# Patient Record
Sex: Female | Born: 1965 | Race: White | Hispanic: No | Marital: Married | Smoking: Never smoker
Health system: Southern US, Community
[De-identification: ages and names within clinical notes are randomized; demographics above are authoritative.]

## PROBLEM LIST (undated history)

## (undated) DIAGNOSIS — F419 Anxiety disorder, unspecified: Secondary | ICD-10-CM

## (undated) HISTORY — PX: NO PAST SURGERIES: SHX2092

## (undated) HISTORY — DX: Anxiety disorder, unspecified: F41.9

---

## 1998-05-02 ENCOUNTER — Inpatient Hospital Stay (HOSPITAL_COMMUNITY): Admission: AD | Admit: 1998-05-02 | Discharge: 1998-05-04 | Payer: Self-pay | Admitting: *Deleted

## 1999-06-10 ENCOUNTER — Encounter: Payer: Self-pay | Admitting: *Deleted

## 1999-06-10 ENCOUNTER — Encounter: Admission: RE | Admit: 1999-06-10 | Discharge: 1999-06-10 | Payer: Self-pay | Admitting: *Deleted

## 1999-06-19 ENCOUNTER — Other Ambulatory Visit: Admission: RE | Admit: 1999-06-19 | Discharge: 1999-06-19 | Payer: Self-pay | Admitting: *Deleted

## 2000-06-27 ENCOUNTER — Other Ambulatory Visit: Admission: RE | Admit: 2000-06-27 | Discharge: 2000-06-27 | Payer: Self-pay | Admitting: *Deleted

## 2001-07-19 ENCOUNTER — Other Ambulatory Visit: Admission: RE | Admit: 2001-07-19 | Discharge: 2001-07-19 | Payer: Self-pay | Admitting: *Deleted

## 2002-07-23 ENCOUNTER — Other Ambulatory Visit: Admission: RE | Admit: 2002-07-23 | Discharge: 2002-07-23 | Payer: Self-pay | Admitting: Obstetrics and Gynecology

## 2003-06-18 ENCOUNTER — Ambulatory Visit (HOSPITAL_COMMUNITY): Admission: RE | Admit: 2003-06-18 | Discharge: 2003-06-18 | Payer: Self-pay | Admitting: Family Medicine

## 2003-07-16 ENCOUNTER — Ambulatory Visit (HOSPITAL_COMMUNITY): Admission: RE | Admit: 2003-07-16 | Discharge: 2003-07-16 | Payer: Self-pay | Admitting: Family Medicine

## 2003-10-28 ENCOUNTER — Other Ambulatory Visit: Admission: RE | Admit: 2003-10-28 | Discharge: 2003-10-28 | Payer: Self-pay | Admitting: Obstetrics and Gynecology

## 2016-01-28 DIAGNOSIS — F419 Anxiety disorder, unspecified: Secondary | ICD-10-CM | POA: Diagnosis not present

## 2017-03-08 DIAGNOSIS — M1712 Unilateral primary osteoarthritis, left knee: Secondary | ICD-10-CM | POA: Diagnosis not present

## 2017-03-21 DIAGNOSIS — M1711 Unilateral primary osteoarthritis, right knee: Secondary | ICD-10-CM | POA: Diagnosis not present

## 2017-03-23 DIAGNOSIS — M1712 Unilateral primary osteoarthritis, left knee: Secondary | ICD-10-CM | POA: Diagnosis not present

## 2017-03-29 DIAGNOSIS — M1711 Unilateral primary osteoarthritis, right knee: Secondary | ICD-10-CM | POA: Diagnosis not present

## 2017-03-31 DIAGNOSIS — M222X2 Patellofemoral disorders, left knee: Secondary | ICD-10-CM | POA: Diagnosis not present

## 2017-04-05 DIAGNOSIS — M1711 Unilateral primary osteoarthritis, right knee: Secondary | ICD-10-CM | POA: Diagnosis not present

## 2017-04-06 DIAGNOSIS — Z1231 Encounter for screening mammogram for malignant neoplasm of breast: Secondary | ICD-10-CM | POA: Diagnosis not present

## 2017-04-07 DIAGNOSIS — M1711 Unilateral primary osteoarthritis, right knee: Secondary | ICD-10-CM | POA: Diagnosis not present

## 2017-04-12 DIAGNOSIS — M1712 Unilateral primary osteoarthritis, left knee: Secondary | ICD-10-CM | POA: Diagnosis not present

## 2017-04-14 DIAGNOSIS — M1712 Unilateral primary osteoarthritis, left knee: Secondary | ICD-10-CM | POA: Diagnosis not present

## 2017-04-18 ENCOUNTER — Encounter: Payer: Self-pay | Admitting: Obstetrics and Gynecology

## 2017-04-19 DIAGNOSIS — M1712 Unilateral primary osteoarthritis, left knee: Secondary | ICD-10-CM | POA: Diagnosis not present

## 2017-04-19 DIAGNOSIS — M222X2 Patellofemoral disorders, left knee: Secondary | ICD-10-CM | POA: Diagnosis not present

## 2017-05-19 ENCOUNTER — Ambulatory Visit: Payer: BLUE CROSS/BLUE SHIELD | Admitting: Obstetrics and Gynecology

## 2017-05-19 ENCOUNTER — Encounter: Payer: Self-pay | Admitting: Gastroenterology

## 2017-05-19 ENCOUNTER — Other Ambulatory Visit (HOSPITAL_COMMUNITY)
Admission: RE | Admit: 2017-05-19 | Discharge: 2017-05-19 | Disposition: A | Discharge status: Home / Self Care | Payer: BLUE CROSS/BLUE SHIELD | Source: Ambulatory Visit | Attending: Obstetrics and Gynecology | Admitting: Obstetrics and Gynecology

## 2017-05-19 ENCOUNTER — Encounter: Payer: Self-pay | Admitting: Obstetrics and Gynecology

## 2017-05-19 ENCOUNTER — Other Ambulatory Visit: Payer: Self-pay

## 2017-05-19 VITALS — BP 148/90 | HR 60 | Resp 14 | Ht 63.75 in | Wt 125.0 lb

## 2017-05-19 DIAGNOSIS — Z0000 Encounter for general adult medical examination without abnormal findings: Secondary | ICD-10-CM | POA: Insufficient documentation

## 2017-05-19 DIAGNOSIS — Z1211 Encounter for screening for malignant neoplasm of colon: Secondary | ICD-10-CM | POA: Diagnosis not present

## 2017-05-19 DIAGNOSIS — Z124 Encounter for screening for malignant neoplasm of cervix: Secondary | ICD-10-CM | POA: Insufficient documentation

## 2017-05-19 DIAGNOSIS — Z01419 Encounter for gynecological examination (general) (routine) without abnormal findings: Secondary | ICD-10-CM

## 2017-05-19 MED ORDER — ESCITALOPRAM OXALATE 10 MG PO TABS: 10.0000 mg | ORAL_TABLET | Freq: Every day | ORAL | 3 refills | Status: DC

## 2017-05-19 NOTE — Progress Notes (Signed)
51 y.o. [id number] Married CaucasianF here for annual exam.  No bleeding at all since January. She has occasional hot flashes, tolerable. No night sweats for many months. Not sexually active, marital issues in the past, now just best friends.     Patient's last menstrual period was 07/18/2016.          Sexually active: No.  The current method of family planning is none.    Exercising: Yes.    bike/ tennis Smoker:  no  Health Maintenance: Pap:  07-19-13 WNL per patient History of abnormal Pap:  Yes- years ago- repeat PAP normal MMG:  04-06-17 WNL Colonoscopy:  Never BMD:   Never TDaP:  12-27-16 Gardasil: N/A   reports that  has never smoked. she has never used smokeless tobacco. She reports that she does not drink alcohol or use drugs. Daughter is [id number], son is 619. Daughter is a 2nd Merchandiser, retailyear pharmacy student at [employer]. Son is a Printmakerreshman in a [employer] [place] (was bullied in high school, much happier in college). Homemaker.   No past medical history on file.  No past surgical history on file.  Current Outpatient Medications  Medication Sig Dispense Refill  . escitalopram (LEXAPRO) 10 MG tablet Take 10 mg daily by mouth.    . Ibuprofen-Famotidine (DUEXIS) 800-26.6 MG TABS Take daily by mouth.     No current facility-administered medications for this visit.     No family history on file.  Review of Systems  Constitutional: Negative.   HENT: Negative.   Eyes: Negative.   Respiratory: Negative.   Cardiovascular: Negative.   Gastrointestinal: Negative.   Endocrine: Negative.   Genitourinary: Negative.        No menstrual cycle since 07/2016 Hot flashes  Musculoskeletal: Negative.   Skin: Negative.   Allergic/Immunologic: Negative.   Neurological: Negative.   Psychiatric/Behavioral: Negative.     Exam:   BP (!) 160/90 (BP Location: Right Arm, Patient Position: Sitting, Cuff Size: Normal)   Pulse 60   Resp 14   Ht 5' 3.75" (1.619 m)   Wt 125 lb (56.7 kg)   LMP  07/18/2016   BMI 21.62 kg/m   Weight change: @WEIGHTCHANGE @ Height:   Height: 5' 3.75" (161.9 cm)  Ht Readings from Last 3 Encounters:  05/19/17 5' 3.75" (1.619 m)    General appearance: alert, cooperative and appears stated age Head: Normocephalic, without obvious abnormality, atraumatic Neck: no adenopathy, supple, symmetrical, trachea midline and thyroid normal to inspection and palpation Lungs: clear to auscultation bilaterally Cardiovascular: regular rate and rhythm Breasts: normal appearance, no masses or tenderness Abdomen: soft, non-tender; non distended,  no masses,  no organomegaly Extremities: extremities normal, atraumatic, no cyanosis or edema Skin: Skin color, texture, turgor normal. No rashes or lesions Lymph nodes: Cervical, supraclavicular, and axillary nodes normal. No abnormal inguinal nodes palpated Neurologic: Grossly normal   Pelvic: External genitalia:  no lesions              Urethra:  normal appearing urethra with no masses, tenderness or lesions              Bartholins and Skenes: normal                 Vagina: normal appearing vagina with normal color and discharge, no lesions              Cervix: no lesions               Bimanual Exam:  Uterus:  normal size, contour, position, consistency, mobility, non-tender              Adnexa: no mass, fullness, tenderness               Rectovaginal: Confirms               Anus:  normal sphincter tone, no lesions  Chaperone was present for exam.  A:  Well Woman with normal exam  Elevated BP,   Depression, well controlled with Lexapro  P:   At home her BP is completely normal, stressed being here, she will f/u with her primary  Screening labs  Continue Lexapro  Needs colonoscopy  Mammogram UTD  Discussed breast self exam  Discussed calcium and vit D intake

## 2017-05-19 NOTE — Addendum Note (Signed)
Addended by: Tobi BastosJERTSON, Jakeel Starliper E on: 05/19/2017 07:22 PM   Modules accepted: Orders

## 2017-05-20 LAB — LIPID PANEL
Chol/HDL Ratio: 3.1 ratio (ref 0.0–4.4)
Cholesterol, Total: 194 mg/dL (ref 100–199)
HDL: 62 mg/dL (ref >39)
LDL Calculated: 121 mg/dL — ABNORMAL HIGH (ref 0–99)
Triglycerides: 53 mg/dL (ref 0–149)
VLDL Cholesterol Cal: 11 mg/dL (ref 5–40)

## 2017-05-20 LAB — CBC
Hematocrit: 40.6 % (ref 34.0–46.6)
Hemoglobin: 14 g/dL (ref 11.1–15.9)
MCH: 31 pg (ref 26.6–33.0)
MCHC: 34.5 g/dL (ref 31.5–35.7)
MCV: 90 fL (ref 79–97)
Platelets: 256 10*3/uL (ref 150–379)
RBC: 4.51 x10E6/uL (ref 3.77–5.28)
RDW: 13.2 % (ref 12.3–15.4)
WBC: 5.2 10*3/uL (ref 3.4–10.8)

## 2017-05-20 LAB — COMPREHENSIVE METABOLIC PANEL
ALT: 13 IU/L (ref 0–32)
AST: 19 IU/L (ref 0–40)
Albumin/Globulin Ratio: 1.8 (ref 1.2–2.2)
Albumin: 4.4 g/dL (ref 3.5–5.5)
Alkaline Phosphatase: 111 IU/L (ref 39–117)
BUN/Creatinine Ratio: 11 (ref 9–23)
BUN: 10 mg/dL (ref 6–24)
Bilirubin Total: 0.4 mg/dL (ref 0.0–1.2)
CO2: 24 mmol/L (ref 20–29)
Calcium: 9.4 mg/dL (ref 8.7–10.2)
Chloride: 102 mmol/L (ref 96–106)
Creatinine, Ser: 0.95 mg/dL (ref 0.57–1.00)
GFR calc Af Amer: 79 mL/min/{1.73_m2} (ref >59)
GFR calc non Af Amer: 69 mL/min/{1.73_m2} (ref >59)
Globulin, Total: 2.5 g/dL (ref 1.5–4.5)
Glucose: 88 mg/dL (ref 65–99)
Potassium: 4.6 mmol/L (ref 3.5–5.2)
Sodium: 141 mmol/L (ref 134–144)
Total Protein: 6.9 g/dL (ref 6.0–8.5)

## 2017-05-24 LAB — CYTOLOGY - PAP
DIAGNOSIS: NEGATIVE
HPV (WINDOPATH): NOT DETECTED

## 2017-06-06 ENCOUNTER — Telehealth: Payer: Self-pay | Admitting: Obstetrics and Gynecology

## 2017-06-06 NOTE — Telephone Encounter (Signed)
Patient is asking for her recent pap result.

## 2017-06-06 NOTE — Telephone Encounter (Signed)
Spoke with patient. Patient states she had not received pap results via mail, f/u with results. Advised patient pap dated 05/19/17 normal. Next AEX previously scheduled. Patient verbalizes understanding and thankful for return call.    Notes recorded by Lorri FrederickHanner, Martha E, LPN on 40/98/119111/13/2018 at 1:03 PM EST AEX 05-25-18 -eh ------  Notes recorded by Romualdo BolkJertson, Peregrine Nolt Evelyn, MD on 05/24/2017 at 12:50 PM EST 02 recall  Routing to provider for final review. Patient is agreeable to disposition. Will close encounter.

## 2017-07-20 ENCOUNTER — Ambulatory Visit (AMBULATORY_SURGERY_CENTER): Payer: Self-pay | Admitting: *Deleted

## 2017-07-20 ENCOUNTER — Other Ambulatory Visit: Payer: Self-pay

## 2017-07-20 VITALS — Ht 64.0 in | Wt 130.0 lb

## 2017-07-20 DIAGNOSIS — Z1211 Encounter for screening for malignant neoplasm of colon: Secondary | ICD-10-CM

## 2017-07-20 MED ORDER — PEG-KCL-NACL-NASULF-NA ASC-C 140 G PO SOLR: 1.0000 | Freq: Once | ORAL | 0 refills | Status: AC

## 2017-07-20 NOTE — Progress Notes (Signed)
Patient denies any allergies to eggs or soy. Patient denies any past surgeries, and no neck problems. Patient denies any oxygen use at home. Patient denies taking any diet/weight loss medications or blood thinners. EMMI education assisgned to patient on colonoscopy, this was explained and instructions given to patient.

## 2017-07-28 ENCOUNTER — Encounter: Payer: Self-pay | Admitting: Gastroenterology

## 2017-08-03 ENCOUNTER — Ambulatory Visit (AMBULATORY_SURGERY_CENTER): Payer: BLUE CROSS/BLUE SHIELD | Admitting: Gastroenterology

## 2017-08-03 ENCOUNTER — Other Ambulatory Visit: Payer: Self-pay

## 2017-08-03 ENCOUNTER — Encounter: Payer: Self-pay | Admitting: Gastroenterology

## 2017-08-03 VITALS — BP 108/59 | HR 67 | Temp 98.4°F | Resp 9 | Ht 64.0 in | Wt 130.0 lb

## 2017-08-03 DIAGNOSIS — Z1211 Encounter for screening for malignant neoplasm of colon: Secondary | ICD-10-CM | POA: Diagnosis not present

## 2017-08-03 DIAGNOSIS — Z1212 Encounter for screening for malignant neoplasm of rectum: Secondary | ICD-10-CM | POA: Diagnosis not present

## 2017-08-03 MED ORDER — SODIUM CHLORIDE 0.9 % IV SOLN: 500.0000 mL | INTRAVENOUS | Status: AC

## 2017-08-03 NOTE — Progress Notes (Signed)
Report to PACU, RN, vss, BBS= Clear.  

## 2017-08-03 NOTE — Patient Instructions (Signed)
YOU HAD AN ENDOSCOPIC PROCEDURE TODAY AT THE [facility]:   Refer to the procedure report that was given to you for any specific questions about what was found during the examination.  If the procedure report does not answer your questions, please call your gastroenterologist to clarify.  If you requested that your care partner not be given the details of your procedure findings, then the procedure report has been included in a sealed envelope for you to review at your convenience later.  YOU SHOULD EXPECT: Some feelings of bloating in the abdomen. Passage of more gas than usual.  Walking can help get rid of the air that was put into your GI tract during the procedure and reduce the bloating. If you had a lower endoscopy (such as a colonoscopy or flexible sigmoidoscopy) you may notice spotting of blood in your stool or on the toilet paper. If you underwent a bowel prep for your procedure, you may not have a normal bowel movement for a few days.  Please Note:  You might notice some irritation and congestion in your nose or some drainage.  This is from the oxygen used during your procedure.  There is no need for concern and it should clear up in a day or so.  SYMPTOMS TO REPORT IMMEDIATELY:   Following lower endoscopy (colonoscopy or flexible sigmoidoscopy):  Excessive amounts of blood in the stool  Significant tenderness or worsening of abdominal pains  Swelling of the abdomen that is new, acute  Fever of 100F or higher  Please see handouts given to you on hemorrhoids.  For urgent or emergent issues, a gastroenterologist can be reached at any hour by calling [phone].   DIET:  We do recommend a small meal at first, but then you may proceed to your regular diet.  Drink plenty of fluids but you should avoid alcoholic beverages for 24 hours.  ACTIVITY:  You should plan to take it easy for the rest of today and you should NOT DRIVE or use heavy machinery until tomorrow (because  of the sedation medicines used during the test).    FOLLOW UP: Our staff will call the number listed on your records the next business day following your procedure to check on you and address any questions or concerns that you may have regarding the information given to you following your procedure. If we do not reach you, we will leave a message.  However, if you are feeling well and you are not experiencing any problems, there is no need to return our call.  We will assume that you have returned to your regular daily activities without incident.  If any biopsies were taken you will be contacted by phone or by letter within the next 1-3 weeks.  Please call us at [phone] if you have not heard about the biopsies in 3 weeks.    SIGNATURES/CONFIDENTIALITY: You and/or your care partner have signed paperwork which will be entered into your electronic medical record.  These signatures attest to the fact that that the information above on your After Visit Summary has been reviewed and is understood.  Full responsibility of the confidentiality of this discharge information lies with you and/or your care-partner.  Thank you for letting us take care of your healthcare needs today.

## 2017-08-03 NOTE — Op Note (Signed)
[facility] Patient Name: Tonea Leiphart Procedure Date: 08/03/2017 11:03 AM MRN: [id number] Endoscopist: Napoleon Form , MD Age: 52 Referring MD:  Date of Birth: Jul 12, 1966 Gender: Female Account #: [id number] Procedure:                Colonoscopy Indications:              Screening for colorectal malignant neoplasm Medicines:                Monitored Anesthesia Care Procedure:                Pre-Anesthesia Assessment:                           - Prior to the procedure, a History and Physical                            was performed, and patient medications and                            allergies were reviewed. The patient's tolerance of                            previous anesthesia was also reviewed. The risks                            and benefits of the procedure and the sedation                            options and risks were discussed with the patient.                            All questions were answered, and informed consent                            was obtained. Prior Anticoagulants: The patient has                            taken no previous anticoagulant or antiplatelet                            agents. ASA Grade Assessment: II - A patient with                            mild systemic disease. After reviewing the risks                            and benefits, the patient was deemed in                            satisfactory condition to undergo the procedure.                           After obtaining informed consent, the colonoscope  was passed under direct vision. Throughout the                            procedure, the patient's blood pressure, pulse, and                            oxygen saturations were monitored continuously. The                            Colonoscope was introduced through the anus and                            advanced to the the terminal ileum, with                            identification of  the appendiceal orifice and IC                            valve. The colonoscopy was performed without                            difficulty. The patient tolerated the procedure                            well. The quality of the bowel preparation was                            good. The terminal ileum, ileocecal valve,                            appendiceal orifice, and rectum were photographed. Scope In: 11:11:18 AM Scope Out: 11:22:57 AM Scope Withdrawal Time: 0 hours 8 minutes 8 seconds  Total Procedure Duration: 0 hours 11 minutes 39 seconds  Findings:                 The perianal exam findings include internal                            hemorrhoids that prolapse with straining, but                            require manual replacement into the anal canal                            (Grade III).                           Non-bleeding external and internal hemorrhoids were                            found during retroflexion and during perianal exam.                            The hemorrhoids were large.  The exam was otherwise without abnormality. Complications:            No immediate complications. Estimated Blood Loss:     Estimated blood loss: none. Impression:               - Internal hemorrhoids that prolapse with                            straining, but require manual replacement into the                            anal canal (Grade III) found on perianal exam.                           - Non-bleeding external and internal hemorrhoids.                           - The examination was otherwise normal.                           - No specimens collected. Recommendation:           - Patient has a contact number available for                            emergencies. The signs and symptoms of potential                            delayed complications were discussed with the                            patient. Return to normal activities tomorrow.                             Written discharge instructions were provided to the                            patient.                           - Resume previous diet.                           - Continue present medications.                           - Repeat colonoscopy in 10 years for screening                            purposes.                           - Refer to a colo-rectal surgeon Dr Maisie Fus at                            appointment to be scheduled for management of large  prolapsed hemorrhoids Napoleon Form, MD 08/03/2017 11:28:11 AM This report has been signed electronically.

## 2017-08-04 ENCOUNTER — Telehealth: Payer: Self-pay | Admitting: *Deleted

## 2017-08-04 NOTE — Telephone Encounter (Signed)
  Follow up Call-  Call back number 08/03/2017  Post procedure Call Back phone  # [phone]  Permission to leave phone message Yes  Some recent data might be hidden     Patient questions:  Do you have a fever, pain , or abdominal swelling? No. Pain Score  0 *  Have you tolerated food without any problems? Yes.    Have you been able to return to your normal activities? Yes.    Do you have any questions about your discharge instructions: Diet   No. Medications  No. Follow up visit  No.  Do you have questions or concerns about your Care? No.  Actions: * If pain score is 4 or above: No action needed, pain <4.

## 2018-04-10 DIAGNOSIS — Z1231 Encounter for screening mammogram for malignant neoplasm of breast: Secondary | ICD-10-CM | POA: Diagnosis not present

## 2018-04-25 ENCOUNTER — Encounter: Payer: Self-pay | Admitting: Obstetrics and Gynecology

## 2018-05-23 NOTE — Progress Notes (Signed)
52 y.o. [id number] Married White or Caucasian Not Hispanic or Latino female here for annual exam.  She is on Lexapro, helps her vasomotor symptoms and her mood. Not sexually active, good friends.     Patient's last menstrual period was 07/18/2016.          Sexually active: No.  The current method of family planning is post menopausal status.    Exercising: Yes.    peloton cycle, isometric and weights  Smoker:  no  Health Maintenance: Pap:  05/19/2017 WNL NEG HR HPV History of abnormal Pap:  Yes- years ago- repeat PAP normal MMG:  04/10/2018 Birads 1 negative Colonoscopy:  07/2017 WNL BMD:   Never TDaP:  12-27-16 Gardasil: N/A   reports that she has never smoked. She has never used smokeless tobacco. She reports that she drinks about 1.0 - 2.0 standard drinks of alcohol per week. She reports that she does not use drugs. Daughter has one more year of pharmacy school [facility] [employer], will likely do a a residency and MBA. Son is sophomore, Manufacturing engineerconstruction management. Will graduate in May.   Past Medical History:  Diagnosis Date  . Anxiety     Past Surgical History:  Procedure Laterality Date  . NO PAST SURGERIES      Current Outpatient Medications  Medication Sig Dispense Refill  . escitalopram (LEXAPRO) 10 MG tablet Take 1 tablet (10 mg total) daily by mouth. 90 tablet 3  . Ibuprofen-Famotidine (DUEXIS) 800-26.6 MG TABS Take daily by mouth.     Current Facility-Administered Medications  Medication Dose Route Frequency Provider Last Rate Last Dose  . 0.9 %  sodium chloride infusion  500 mL Intravenous Continuous Nandigam, Eleonore ChiquitoKavitha V, MD        Family History  Problem Relation Age of Onset  . Osteoarthritis Mother   . Osteoporosis Mother   . Thyroid disease Mother   . Pancreatic cancer Father   . Heart disease Brother   . Psoriasis Brother   . Diabetes Brother   . Rheum arthritis Maternal Uncle   . Breast cancer Maternal Uncle   . Heart attack Maternal Grandmother 89  . Aneurysm  Paternal Grandmother   . Heart disease Paternal Grandfather   . Colon cancer Neg Hx     Review of Systems  Constitutional: Negative.        Weight gain  HENT: Negative.   Eyes: Negative.   Respiratory: Negative.   Cardiovascular: Negative.   Gastrointestinal: Negative.   Endocrine: Negative.   Genitourinary: Positive for frequency and urgency.  Musculoskeletal:       Joint/muscle pain  Skin: Negative.   Allergic/Immunologic: Negative.   Neurological: Negative.   Hematological: Negative.   Psychiatric/Behavioral: Negative.     Exam:   BP 138/90 (BP Location: Right Arm, Patient Position: Sitting, Cuff Size: Normal)   Pulse 60   Ht 5\' 4"  (1.626 m)   Wt 130 lb 9.6 oz (59.2 kg)   LMP 07/18/2016   BMI 22.42 kg/m   Weight change: @WEIGHTCHANGE @ Height:   Height: 5\' 4"  (162.6 cm)  Ht Readings from Last 3 Encounters:  05/25/18 5\' 4"  (1.626 m)  08/03/17 5\' 4"  (1.626 m)  07/20/17 5\' 4"  (1.626 m)    General appearance: alert, cooperative and appears stated age Head: Normocephalic, without obvious abnormality, atraumatic Neck: no adenopathy, supple, symmetrical, trachea midline and thyroid normal to inspection and palpation Lungs: clear to auscultation bilaterally Cardiovascular: regular rate and rhythm Breasts: normal appearance, no masses or tenderness Abdomen:  soft, non-tender; non distended,  no masses,  no organomegaly Extremities: extremities normal, atraumatic, no cyanosis or edema Skin: Skin color, texture, turgor normal. No rashes or lesions Lymph nodes: Cervical, supraclavicular, and axillary nodes normal. No abnormal inguinal nodes palpated Neurologic: Grossly normal   Pelvic: External genitalia:  no lesions              Urethra:  normal appearing urethra with no masses, tenderness or lesions              Bartholins and Skenes: normal                 Vagina: atrophic appearing vagina with normal color and discharge, no lesions              Cervix: no lesions                Bimanual Exam:  Uterus:  normal size, contour, position, consistency, mobility, non-tender              Adnexa: no mass, fullness, tenderness               Rectovaginal: Confirms               Anus:  normal sphincter tone, no lesions  Chaperone was present for exam.  A:  Well Woman with normal exam  Anxiety, controlled with lexapro  Vasomotor symptoms, controlled on lexapro  P:   No pap this year  Discussed breast self exam  Discussed calcium and vit D intake  Mammogram and colonoscopy UTD  Continue lexapro  Screening labs

## 2018-05-25 ENCOUNTER — Ambulatory Visit: Payer: BLUE CROSS/BLUE SHIELD | Admitting: Obstetrics and Gynecology

## 2018-05-25 ENCOUNTER — Other Ambulatory Visit: Payer: Self-pay

## 2018-05-25 ENCOUNTER — Encounter: Payer: Self-pay | Admitting: Obstetrics and Gynecology

## 2018-05-25 VITALS — BP 138/90 | HR 60 | Ht 64.0 in | Wt 130.6 lb

## 2018-05-25 DIAGNOSIS — Z0000 Encounter for general adult medical examination without abnormal findings: Secondary | ICD-10-CM

## 2018-05-25 DIAGNOSIS — Z01419 Encounter for gynecological examination (general) (routine) without abnormal findings: Secondary | ICD-10-CM | POA: Diagnosis not present

## 2018-05-25 MED ORDER — ESCITALOPRAM OXALATE 10 MG PO TABS: 10.0000 mg | ORAL_TABLET | Freq: Every day | ORAL | 3 refills | Status: DC

## 2018-05-25 NOTE — Patient Instructions (Signed)
EXERCISE AND DIET:  We recommended that you start or continue a regular exercise program for good health. Regular exercise means any activity that makes your heart beat faster and makes you sweat.  We recommend exercising at least 30 minutes per day at least 3 days a week, preferably 4 or 5.  We also recommend a diet low in fat and sugar.  Inactivity, poor dietary choices and obesity can cause diabetes, heart attack, stroke, and kidney damage, among others.    ALCOHOL AND SMOKING:  Women should limit their alcohol intake to no more than 7 drinks/beers/glasses of wine (combined, not each!) per week. Moderation of alcohol intake to this level decreases your risk of breast cancer and liver damage. And of course, no recreational drugs are part of a healthy lifestyle.  And absolutely no smoking or even second hand smoke. Most people know smoking can cause heart and lung diseases, but did you know it also contributes to weakening of your bones? Aging of your skin?  Yellowing of your teeth and nails?  CALCIUM AND VITAMIN D:  Adequate intake of calcium and Vitamin D are recommended.  The recommendations for exact amounts of these supplements seem to change often, but generally speaking 600 mg of calcium (either carbonate or citrate) and 800 units of Vitamin D per day seems prudent. Certain women may benefit from higher intake of Vitamin D.  If you are among these women, your doctor will have told you during your visit.    PAP SMEARS:  Pap smears, to check for cervical cancer or precancers,  have traditionally been done yearly, although recent scientific advances have shown that most women can have pap smears less often.  However, every woman still should have a physical exam from her gynecologist every year. It will include a breast check, inspection of the vulva and vagina to check for abnormal growths or skin changes, a visual exam of the cervix, and then an exam to evaluate the size and shape of the uterus and  ovaries.  And after 54 years of age, a rectal exam is indicated to check for rectal cancers. We will also provide age appropriate advice regarding health maintenance, like when you should have certain vaccines, screening for sexually transmitted diseases, bone density testing, colonoscopy, mammograms, etc.   MAMMOGRAMS:  All women over 40 years old should have a yearly mammogram. Many facilities now offer a "3D" mammogram, which may cost around $50 extra out of pocket. If possible,  we recommend you accept the option to have the 3D mammogram performed.  It both reduces the number of women who will be called back for extra views which then turn out to be normal, and it is better than the routine mammogram at detecting truly abnormal areas.    COLONOSCOPY:  Colonoscopy to screen for colon cancer is recommended for all women at age 50.  We know, you hate the idea of the prep.  We agree, BUT, having colon cancer and not knowing it is worse!!  Colon cancer so often starts as a polyp that can be seen and removed at colonscopy, which can quite literally save your life!  And if your first colonoscopy is normal and you have no family history of colon cancer, most women don't have to have it again for 10 years.  Once every ten years, you can do something that may end up saving your life, right?  We will be happy to help you get it scheduled when you are ready.    Be sure to check your insurance coverage so you understand how much it will cost.  It may be covered as a preventative service at no cost, but you should check your particular policy.      Breast Self-Awareness Breast self-awareness means being familiar with how your breasts look and feel. It involves checking your breasts regularly and reporting any changes to your health care provider. Practicing breast self-awareness is important. A change in your breasts can be a sign of a serious medical problem. Being familiar with how your breasts look and feel allows  you to find any problems early, when treatment is more likely to be successful. All women should practice breast self-awareness, including women who have had breast implants. How to do a breast self-exam One way to learn what is normal for your breasts and whether your breasts are changing is to do a breast self-exam. To do a breast self-exam: Look for Changes  1. Remove all the clothing above your waist. 2. Stand in front of a mirror in a room with good lighting. 3. Put your hands on your hips. 4. Push your hands firmly downward. 5. Compare your breasts in the mirror. Look for differences between them (asymmetry), such as: ? Differences in shape. ? Differences in size. ? Puckers, dips, and bumps in one breast and not the other. 6. Look at each breast for changes in your skin, such as: ? Redness. ? Scaly areas. 7. Look for changes in your nipples, such as: ? Discharge. ? Bleeding. ? Dimpling. ? Redness. ? A change in position. Feel for Changes  Carefully feel your breasts for lumps and changes. It is best to do this while lying on your back on the floor and again while sitting or standing in the shower or tub with soapy water on your skin. Feel each breast in the following way:  Place the arm on the side of the breast you are examining above your head.  Feel your breast with the other hand.  Start in the nipple area and make  inch (2 cm) overlapping circles to feel your breast. Use the pads of your three middle fingers to do this. Apply light pressure, then medium pressure, then firm pressure. The light pressure will allow you to feel the tissue closest to the skin. The medium pressure will allow you to feel the tissue that is a little deeper. The firm pressure will allow you to feel the tissue close to the ribs.  Continue the overlapping circles, moving downward over the breast until you feel your ribs below your breast.  Move one finger-width toward the center of the body.  Continue to use the  inch (2 cm) overlapping circles to feel your breast as you move slowly up toward your collarbone.  Continue the up and down exam using all three pressures until you reach your armpit.  Write Down What You Find  Write down what is normal for each breast and any changes that you find. Keep a written record with breast changes or normal findings for each breast. By writing this information down, you do not need to depend only on memory for size, tenderness, or location. Write down where you are in your menstrual cycle, if you are still menstruating. If you are having trouble noticing differences in your breasts, do not get discouraged. With time you will become more familiar with the variations in your breasts and more comfortable with the exam. How often should I examine my breasts? Examine   your breasts every month. If you are breastfeeding, the best time to examine your breasts is after a feeding or after using a breast pump. If you menstruate, the best time to examine your breasts is 5-7 days after your period is over. During your period, your breasts are lumpier, and it may be more difficult to notice changes. When should I see my health care provider? See your health care provider if you notice:  A change in shape or size of your breasts or nipples.  A change in the skin of your breast or nipples, such as a reddened or scaly area.  Unusual discharge from your nipples.  A lump or thick area that was not there before.  Pain in your breasts.  Anything that concerns you.  This information is not intended to replace advice given to you by your health care provider. Make sure you discuss any questions you have with your health care provider. Document Released: 06/28/2005 Document Revised: 12/04/2015 Document Reviewed: 05/18/2015 Elsevier Interactive Patient Education  2018 Elsevier Inc[employer]  

## 2018-05-26 LAB — COMPREHENSIVE METABOLIC PANEL
A/G RATIO: 1.6 (ref 1.2–2.2)
ALBUMIN: 4.6 g/dL (ref 3.5–5.5)
ALK PHOS: 125 IU/L — AB (ref 39–117)
ALT: 12 IU/L (ref 0–32)
AST: 18 IU/L (ref 0–40)
BUN / CREAT RATIO: 17 (ref 9–23)
BUN: 16 mg/dL (ref 6–24)
Bilirubin Total: 0.4 mg/dL (ref 0.0–1.2)
CHLORIDE: 101 mmol/L (ref 96–106)
CO2: 23 mmol/L (ref 20–29)
Calcium: 9.7 mg/dL (ref 8.7–10.2)
Creatinine, Ser: 0.96 mg/dL (ref 0.57–1.00)
GFR calc Af Amer: 78 mL/min/{1.73_m2} (ref >59)
GFR calc non Af Amer: 67 mL/min/{1.73_m2} (ref >59)
GLUCOSE: 81 mg/dL (ref 65–99)
Globulin, Total: 2.8 g/dL (ref 1.5–4.5)
Potassium: 4.2 mmol/L (ref 3.5–5.2)
Sodium: 141 mmol/L (ref 134–144)
Total Protein: 7.4 g/dL (ref 6.0–8.5)

## 2018-05-26 LAB — LIPID PANEL
CHOLESTEROL TOTAL: 219 mg/dL — AB (ref 100–199)
Chol/HDL Ratio: 2.9 ratio (ref 0.0–4.4)
HDL: 76 mg/dL (ref >39)
LDL Calculated: 130 mg/dL — ABNORMAL HIGH (ref 0–99)
Triglycerides: 63 mg/dL (ref 0–149)
VLDL CHOLESTEROL CAL: 13 mg/dL (ref 5–40)

## 2018-05-26 LAB — CBC
Hematocrit: 41.4 % (ref 34.0–46.6)
Hemoglobin: 14.2 g/dL (ref 11.1–15.9)
MCH: 31.5 pg (ref 26.6–33.0)
MCHC: 34.3 g/dL (ref 31.5–35.7)
MCV: 92 fL (ref 79–97)
PLATELETS: 278 10*3/uL (ref 150–450)
RBC: 4.51 x10E6/uL (ref 3.77–5.28)
RDW: 12.3 % (ref 12.3–15.4)
WBC: 6.6 10*3/uL (ref 3.4–10.8)

## 2018-05-29 ENCOUNTER — Other Ambulatory Visit: Payer: Self-pay

## 2018-05-29 DIAGNOSIS — R748 Abnormal levels of other serum enzymes: Secondary | ICD-10-CM

## 2018-05-30 ENCOUNTER — Other Ambulatory Visit (INDEPENDENT_AMBULATORY_CARE_PROVIDER_SITE_OTHER): Payer: BLUE CROSS/BLUE SHIELD

## 2018-05-30 DIAGNOSIS — R748 Abnormal levels of other serum enzymes: Secondary | ICD-10-CM | POA: Diagnosis not present

## 2018-05-31 LAB — ALKALINE PHOSPHATASE: Alkaline Phosphatase: 116 IU/L (ref 39–117)

## 2019-04-16 DIAGNOSIS — Z20828 Contact with and (suspected) exposure to other viral communicable diseases: Secondary | ICD-10-CM | POA: Diagnosis not present

## 2019-04-24 DIAGNOSIS — Z20828 Contact with and (suspected) exposure to other viral communicable diseases: Secondary | ICD-10-CM | POA: Diagnosis not present

## 2019-04-27 ENCOUNTER — Encounter: Payer: Self-pay | Admitting: Obstetrics and Gynecology

## 2019-04-27 DIAGNOSIS — Z1231 Encounter for screening mammogram for malignant neoplasm of breast: Secondary | ICD-10-CM | POA: Diagnosis not present

## 2019-05-28 ENCOUNTER — Other Ambulatory Visit: Payer: Self-pay

## 2019-05-30 ENCOUNTER — Other Ambulatory Visit: Payer: Self-pay

## 2019-05-30 ENCOUNTER — Ambulatory Visit (INDEPENDENT_AMBULATORY_CARE_PROVIDER_SITE_OTHER): Payer: BC Managed Care – PPO | Admitting: Obstetrics and Gynecology

## 2019-05-30 ENCOUNTER — Encounter: Payer: Self-pay | Admitting: Obstetrics and Gynecology

## 2019-05-30 VITALS — BP 158/96 | HR 64 | Temp 97.4°F | Ht 64.0 in | Wt 134.8 lb

## 2019-05-30 DIAGNOSIS — Z01419 Encounter for gynecological examination (general) (routine) without abnormal findings: Secondary | ICD-10-CM

## 2019-05-30 DIAGNOSIS — R030 Elevated blood-pressure reading, without diagnosis of hypertension: Secondary | ICD-10-CM

## 2019-05-30 DIAGNOSIS — Z0000 Encounter for general adult medical examination without abnormal findings: Secondary | ICD-10-CM

## 2019-05-30 DIAGNOSIS — E789 Disorder of lipoprotein metabolism, unspecified: Secondary | ICD-10-CM | POA: Diagnosis not present

## 2019-05-30 MED ORDER — ESCITALOPRAM OXALATE 10 MG PO TABS: 10.0000 mg | ORAL_TABLET | Freq: Every day | ORAL | 3 refills | Status: DC

## 2019-05-30 NOTE — Progress Notes (Signed)
53 y.o. [id number] Married White or Caucasian Not Hispanic or Latino female here for annual exam. On lexapro for vasomotor symptoms and her mood.   Still having hot flashes or sweats, mainly at night. Overall tolerable with the lexapro. Mood is good. No vaginal bleeding.     Patient's last menstrual period was 07/18/2016.          Sexually active: No.  The current method of family planning is post menopausal status.    Exercising: Yes.    peloton bike, spin bike, 6-7 times a week Smoker:  no  Health Maintenance: Pap:05/19/2017 WNL NEG HR HPV History of abnormal Pap:Yes- years ago- repeat PAP normal MMG:04/27/2019 Birads 1 negative Colonoscopy:07/2017 WNL JOI:NOMVE TDaP:12-27-16 Gardasil:N/A   reports that she has never smoked. She has never used smokeless tobacco. She reports current alcohol use of about 1.0 - 2.0 standard drinks of alcohol per week. She reports that she does not use drugs. Daughter is Chiropodist school this year. She will do a fellowship and get her MBA. Son graduated from [employer], he is working in [employer], Research officer, political party.    Past Medical History:  Diagnosis Date  . Anxiety     Past Surgical History:  Procedure Laterality Date  . NO PAST SURGERIES      Current Outpatient Medications  Medication Sig Dispense Refill  . escitalopram (LEXAPRO) 10 MG tablet Take 1 tablet (10 mg total) by mouth daily. 90 tablet 3   Current Facility-Administered Medications  Medication Dose Route Frequency Provider Last Rate Last Dose  . 0.9 %  sodium chloride infusion  500 mL Intravenous Continuous Nandigam, Eleonore Chiquito, MD        Family History  Problem Relation Age of Onset  . Osteoarthritis Mother   . Osteoporosis Mother   . Thyroid disease Mother   . Pancreatic cancer Father   . Heart disease Brother   . Psoriasis Brother   . Diabetes Brother   . Rheum arthritis Maternal Uncle   . Breast cancer Maternal Uncle   . Heart attack Maternal  Grandmother 89  . Aneurysm Paternal Grandmother   . Heart disease Paternal Grandfather   . Diabetes Maternal Aunt   . Colon cancer Neg Hx     Review of Systems  Constitutional: Negative.   HENT: Negative.   Eyes: Negative.   Respiratory: Negative.   Cardiovascular: Negative.   Gastrointestinal: Negative.   Endocrine: Negative.   Genitourinary: Negative.   Musculoskeletal: Negative.   Skin: Negative.   Allergic/Immunologic: Negative.   Neurological: Negative.   Hematological: Negative.   Psychiatric/Behavioral: Negative.     Exam:   BP (!) 158/80 (BP Location: Right Arm, Patient Position: Sitting, Cuff Size: Normal)   Pulse 64   Temp (!) 97.4 F (36.3 C) (Skin)   Ht 5\' 4"  (1.626 m)   Wt 134 lb 12.8 oz (61.1 kg)   LMP 07/18/2016   BMI 23.14 kg/m   Weight change: @WEIGHTCHANGE @ Height:   Height: 5\' 4"  (162.6 cm)  Ht Readings from Last 3 Encounters:  05/30/19 5\' 4"  (1.626 m)  05/25/18 5\' 4"  (1.626 m)  08/03/17 5\' 4"  (1.626 m)    General appearance: alert, cooperative and appears stated age Head: Normocephalic, without obvious abnormality, atraumatic Neck: no adenopathy, supple, symmetrical, trachea midline and thyroid normal to inspection and palpation Lungs: clear to auscultation bilaterally Cardiovascular: regular rate and rhythm Breasts: normal appearance, no masses or tenderness Abdomen: soft, non-tender; non distended,  no masses,  no organomegaly Extremities:  extremities normal, atraumatic, no cyanosis or edema Skin: Skin color, texture, turgor normal. No rashes or lesions Lymph nodes: Cervical, supraclavicular, and axillary nodes normal. No abnormal inguinal nodes palpated Neurologic: Grossly normal   Pelvic: External genitalia:  no lesions              Urethra:  normal appearing urethra with no masses, tenderness or lesions              Bartholins and Skenes: normal                 Vagina: atrophic appearing vagina with normal color and discharge, no  lesions              Cervix: no lesions               Bimanual Exam:  Uterus:  normal size, contour, position, consistency, mobility, non-tender              Adnexa: no mass, fullness, tenderness               Rectovaginal: Confirms               Anus:  normal sphincter tone, no lesions  Chaperone was present for exam.  A:  Well Woman with normal exam  Elevated BP without diagnosis of HTN  P:   Pap UTD  Mammogram UTD  Colonoscopy UTD  Discussed breast self exam  Discussed calcium and vit D intake  Recheck BP

## 2019-05-30 NOTE — Progress Notes (Signed)
Recheck BP was 158/96. Patient reports that she checks her BP at home and it is always normal 120s/80s. Advised to continue checking BP and if it fluctuates or remains elevated will need to seek medical evaluation as soon as possible. Patient verbalizes understanding.

## 2019-05-30 NOTE — Patient Instructions (Signed)

## 2019-05-31 LAB — CBC
Hematocrit: 43.1 % (ref 34.0–46.6)
Hemoglobin: 14.9 g/dL (ref 11.1–15.9)
MCH: 31.6 pg (ref 26.6–33.0)
MCHC: 34.6 g/dL (ref 31.5–35.7)
MCV: 92 fL (ref 79–97)
Platelets: 261 10*3/uL (ref 150–450)
RBC: 4.71 x10E6/uL (ref 3.77–5.28)
RDW: 12.1 % (ref 11.7–15.4)
WBC: 5.7 10*3/uL (ref 3.4–10.8)

## 2019-05-31 LAB — COMPREHENSIVE METABOLIC PANEL
ALT: 13 IU/L (ref 0–32)
AST: 23 IU/L (ref 0–40)
Albumin/Globulin Ratio: 1.6 (ref 1.2–2.2)
Albumin: 4.4 g/dL (ref 3.8–4.9)
Alkaline Phosphatase: 143 IU/L — ABNORMAL HIGH (ref 39–117)
BUN/Creatinine Ratio: 12 (ref 9–23)
BUN: 12 mg/dL (ref 6–24)
Bilirubin Total: 0.5 mg/dL (ref 0.0–1.2)
CO2: 24 mmol/L (ref 20–29)
Calcium: 9.7 mg/dL (ref 8.7–10.2)
Chloride: 101 mmol/L (ref 96–106)
Creatinine, Ser: 0.98 mg/dL (ref 0.57–1.00)
GFR calc Af Amer: 75 mL/min/{1.73_m2} (ref >59)
GFR calc non Af Amer: 65 mL/min/{1.73_m2} (ref >59)
Globulin, Total: 2.8 g/dL (ref 1.5–4.5)
Glucose: 91 mg/dL (ref 65–99)
Potassium: 5 mmol/L (ref 3.5–5.2)
Sodium: 141 mmol/L (ref 134–144)
Total Protein: 7.2 g/dL (ref 6.0–8.5)

## 2019-05-31 LAB — LIPID PANEL
Chol/HDL Ratio: 3.9 ratio (ref 0.0–4.4)
Cholesterol, Total: 264 mg/dL — ABNORMAL HIGH (ref 100–199)
HDL: 67 mg/dL (ref >39)
LDL Chol Calc (NIH): 176 mg/dL — ABNORMAL HIGH (ref 0–99)
Triglycerides: 117 mg/dL (ref 0–149)
VLDL Cholesterol Cal: 21 mg/dL (ref 5–40)

## 2019-06-04 ENCOUNTER — Telehealth: Payer: Self-pay

## 2019-06-04 NOTE — Telephone Encounter (Signed)
Left message to call Doneen Ollinger at [phone]. 

## 2019-06-04 NOTE — Telephone Encounter (Signed)
Patient is returning a call to Kaitlyn. °

## 2019-06-04 NOTE — Telephone Encounter (Signed)
Spoke with patient. Advised of results and message as seen below from [place]. Patient verbalizes understanding. Labs sent to patient's PCP. Fasting lab appointment scheduled for 06/06/2019 at 8:45 am as patient was not fasting the day of her lab work. Encounter closed.

## 2019-06-04 NOTE — Telephone Encounter (Signed)
-----  Message from Salvadore Dom, MD sent at 05/31/2019  2:54 PM EST ----- Please let the patient know that her alk phos is elevated again (it was elevated last year, repeat was normal). If she wasn't fasting, have her return for a fasting alk phos and GTT. If she was, she should f/u with her primary. Her LDL is also higher which increases her risk for heart disease. She should eat a diet low in saturated fats, and get f/u testing with her primary.  Please send a copy of her labs to her primary.

## 2019-06-06 ENCOUNTER — Other Ambulatory Visit: Payer: Self-pay

## 2019-06-06 ENCOUNTER — Other Ambulatory Visit: Payer: Self-pay | Admitting: *Deleted

## 2019-06-06 ENCOUNTER — Other Ambulatory Visit (INDEPENDENT_AMBULATORY_CARE_PROVIDER_SITE_OTHER): Payer: BC Managed Care – PPO

## 2019-06-06 DIAGNOSIS — R748 Abnormal levels of other serum enzymes: Secondary | ICD-10-CM

## 2019-06-07 LAB — GAMMA GT: GGT: 26 IU/L (ref 0–60)

## 2019-06-07 LAB — ALKALINE PHOSPHATASE: Alkaline Phosphatase: 128 IU/L — ABNORMAL HIGH (ref 39–117)

## 2019-12-14 DIAGNOSIS — H9201 Otalgia, right ear: Secondary | ICD-10-CM | POA: Diagnosis not present

## 2019-12-14 DIAGNOSIS — Z8619 Personal history of other infectious and parasitic diseases: Secondary | ICD-10-CM | POA: Diagnosis not present

## 2020-05-01 ENCOUNTER — Encounter: Payer: Self-pay | Admitting: Obstetrics and Gynecology

## 2020-05-01 DIAGNOSIS — Z1231 Encounter for screening mammogram for malignant neoplasm of breast: Secondary | ICD-10-CM | POA: Diagnosis not present

## 2020-05-06 DIAGNOSIS — Z23 Encounter for immunization: Secondary | ICD-10-CM | POA: Diagnosis not present

## 2020-06-02 NOTE — Progress Notes (Signed)
54 y.o. [id number] Married White or Caucasian Not Hispanic or Latino female here for annual exam.  She is still having hot flashes and night sweats, may be getting a little better. Lexapro helps as well. She is eating healthier and exercising way more.     Patient's last menstrual period was 07/18/2016.          Sexually active:  No The current method of family planning is post menopausal status.    Exercising: Yes.    pellaton, bar, weights Smoker:  no  Health Maintenance: Pap: 05/19/2017 WNL NEG HR HPV  History of abnormal Pap:  Yes- years ago- repeat PAP normal MMG:  04/2020 WNL report requested  BMD:   Never  Colonoscopy: 2019 WNL  TDaP:  12/27/16  Gardasil: NA    reports that she has never smoked. She has never used smokeless tobacco. She reports current alcohol use of about 1.0 - 2.0 standard drink of alcohol per week. She reports that she does not use drugs. Homemaker. Husband is semi-retired, was a Building services engineer and looking for a new job. Daughter graduated from Pharmacy school in May, doing a fellowship and plans to get her [facility]. Son works as a Research officer, political party in [employer].   Past Medical History:  Diagnosis Date  . Anxiety     Past Surgical History:  Procedure Laterality Date  . NO PAST SURGERIES      Current Outpatient Medications  Medication Sig Dispense Refill  . escitalopram (LEXAPRO) 10 MG tablet Take 1 tablet (10 mg total) by mouth daily. 90 tablet 3   Current Facility-Administered Medications  Medication Dose Route Frequency Provider Last Rate Last Admin  . 0.9 %  sodium chloride infusion  500 mL Intravenous Continuous Nandigam, Eleonore Chiquito, MD        Family History  Problem Relation Age of Onset  . Osteoarthritis Mother   . Osteoporosis Mother   . Thyroid disease Mother   . Pancreatic cancer Father   . Heart disease Brother   . Psoriasis Brother   . Diabetes Brother   . Rheum arthritis Maternal Uncle   . Breast cancer Maternal Uncle   . Heart attack Maternal  Grandmother 89  . Aneurysm Paternal Grandmother   . Heart disease Paternal Grandfather   . Diabetes Maternal Aunt   . Colon cancer Neg Hx     Review of Systems  All other systems reviewed and are negative.   Exam:   BP 140/68   Pulse (!) 54   Ht 5\' 4"  (1.626 m)   Wt 130 lb 9.6 oz (59.2 kg)   LMP 07/18/2016   SpO2 100%   BMI 22.42 kg/m   Weight change: @WEIGHTCHANGE @ Height:   Height: 5\' 4"  (162.6 cm)  Ht Readings from Last 3 Encounters:  06/04/20 5\' 4"  (1.626 m)  05/30/19 5\' 4"  (1.626 m)  05/25/18 5\' 4"  (1.626 m)    General appearance: alert, cooperative and appears stated age Head: Normocephalic, without obvious abnormality, atraumatic Neck: no adenopathy, supple, symmetrical, trachea midline and thyroid normal to inspection and palpation Lungs: clear to auscultation bilaterally Cardiovascular: regular rate and rhythm Breasts: normal appearance, no masses or tenderness Abdomen: soft, non-tender; non distended,  no masses,  no organomegaly Extremities: extremities normal, atraumatic, no cyanosis or edema Skin: Skin color, texture, turgor normal. No rashes or lesions Lymph nodes: Cervical, supraclavicular, and axillary nodes normal. No abnormal inguinal nodes palpated Neurologic: Grossly normal   Pelvic: External genitalia:  no lesions  Urethra:  normal appearing urethra with no masses, tenderness or lesions              Bartholins and Skenes: normal                 Vagina: atrophic appearing vagina with normal color and discharge, no lesions              Cervix: no lesions               Bimanual Exam:  Uterus:  normal size, contour, position, consistency, mobility, non-tender              Adnexa: no mass, fullness, tenderness               Rectovaginal: Confirms               Anus:  normal sphincter tone, no lesions  Carolynn Serve chaperoned for the exam.  A:  Well Woman with normal exam  White coat HTN, always normal at home.   P:   Fasting  labs  Discussed breast self exam  Discussed calcium and vit D intake  Mammogram and colonoscopy UTD  Pap is UTD.  Repeat BP 148/86. She will check her BP at home and then take her cuff to her primary for comparison.

## 2020-06-04 ENCOUNTER — Encounter: Payer: Self-pay | Admitting: Obstetrics and Gynecology

## 2020-06-04 ENCOUNTER — Other Ambulatory Visit: Payer: Self-pay

## 2020-06-04 ENCOUNTER — Ambulatory Visit (INDEPENDENT_AMBULATORY_CARE_PROVIDER_SITE_OTHER): Payer: BC Managed Care – PPO | Admitting: Obstetrics and Gynecology

## 2020-06-04 VITALS — BP 140/68 | HR 54 | Ht 64.0 in | Wt 130.6 lb

## 2020-06-04 DIAGNOSIS — Z01419 Encounter for gynecological examination (general) (routine) without abnormal findings: Secondary | ICD-10-CM

## 2020-06-04 DIAGNOSIS — R030 Elevated blood-pressure reading, without diagnosis of hypertension: Secondary | ICD-10-CM

## 2020-06-04 DIAGNOSIS — E7800 Pure hypercholesterolemia, unspecified: Secondary | ICD-10-CM

## 2020-06-04 DIAGNOSIS — Z0000 Encounter for general adult medical examination without abnormal findings: Secondary | ICD-10-CM

## 2020-06-04 MED ORDER — ESCITALOPRAM OXALATE 10 MG PO TABS: 10.0000 mg | ORAL_TABLET | Freq: Every day | ORAL | 3 refills | Status: DC

## 2020-06-04 NOTE — Patient Instructions (Signed)
EXERCISE   We recommended that you start or continue a regular exercise program for good health. Physical activity is anything that gets your body moving, some is better than none. The CDC recommends 150 minutes per week of Moderate-Intensity Aerobic Activity and 2 or more days of Muscle Strengthening Activity.  Benefits of exercise are limitless: helps weight loss/weight maintenance, improves mood and energy, helps with depression and anxiety, improves sleep, tones and strengthens muscles, improves balance, improves bone density, protects from chronic conditions such as heart disease, high blood pressure and diabetes and so much more. To learn more visit: https://www.cdc.gov/physicalactivity/index.html  DIET: Good nutrition starts with a healthy diet of fruits, vegetables, whole grains, and lean protein sources. Drink plenty of water for hydration. Minimize empty calories, sodium, sweets. For more information about dietary recommendations visit: https://health.gov/our-work/nutrition-physical-activity/dietary-guidelines and https://www.myplate.gov/  ALCOHOL:  Women should limit their alcohol intake to no more than 7 drinks/beers/glasses of wine (combined, not each!) per week. Moderation of alcohol intake to this level decreases your risk of breast cancer and liver damage.  If you are concerned that you may have a problem, or your friends have told you they are concerned about your drinking, there are many resources to help. A well-known program that is free, effective, and available to all people all over the nation is Alcoholics Anonymous.  Check out this site to learn more: https://www.aa.org/   CALCIUM AND VITAMIN D:  Adequate intake of calcium and Vitamin D are recommended for bone health.  The recommendations for exact amounts of these supplements seem to change often, but generally speaking 1000-1500 mg of calcium (between diet and supplement) and 800 units of Vitamin D per day seems prudent.      PAP SMEARS:  Pap smears, to check for cervical cancer or precancers,  have traditionally been done yearly, although recent scientific advances have shown that most women can have pap smears less often.  However, every woman still should have a physical exam from her gynecologist every year. It will include a breast check, inspection of the vulva and vagina to check for abnormal growths or skin changes, a visual exam of the cervix, and then an exam to evaluate the size and shape of the uterus and ovaries.  And after 56 years of age, a rectal exam is indicated to check for rectal cancers. We will also provide age appropriate advice regarding health maintenance, like when you should have certain vaccines, screening for sexually transmitted diseases, bone density testing, colonoscopy, mammograms, etc.   MAMMOGRAMS:  All women over 40 years old should have a routine mammogram.   COLON CANCER SCREENING: Now recommend starting at age 45. At this time colonoscopy is not covered for routine screening until 50. There are take home tests that can be done between 45-49.   COLONOSCOPY:  Colonoscopy to screen for colon cancer is recommended for all women at age 50.  We know, you hate the idea of the prep.  We agree, BUT, having colon cancer and not knowing it is worse!!  Colon cancer so often starts as a polyp that can be seen and removed at colonscopy, which can quite literally save your life!  And if your first colonoscopy is normal and you have no family history of colon cancer, most women don't have to have it again for 10 years.  Once every ten years, you can do something that may end up saving your life, right?  We will be happy to help you get it scheduled when   you are ready.  Be sure to check your insurance coverage so you understand how much it will cost.  It may be covered as a preventative service at no cost, but you should check your particular policy.      Breast Self-Awareness Breast self-awareness  means being familiar with how your breasts look and feel. It involves checking your breasts regularly and reporting any changes to your health care provider. Practicing breast self-awareness is important. A change in your breasts can be a sign of a serious medical problem. Being familiar with how your breasts look and feel allows you to find any problems early, when treatment is more likely to be successful. All women should practice breast self-awareness, including women who have had breast implants. How to do a breast self-exam One way to learn what is normal for your breasts and whether your breasts are changing is to do a breast self-exam. To do a breast self-exam: Look for Changes  1. Remove all the clothing above your waist. 2. Stand in front of a mirror in a room with good lighting. 3. Put your hands on your hips. 4. Push your hands firmly downward. 5. Compare your breasts in the mirror. Look for differences between them (asymmetry), such as: ? Differences in shape. ? Differences in size. ? Puckers, dips, and bumps in one breast and not the other. 6. Look at each breast for changes in your skin, such as: ? Redness. ? Scaly areas. 7. Look for changes in your nipples, such as: ? Discharge. ? Bleeding. ? Dimpling. ? Redness. ? A change in position. Feel for Changes Carefully feel your breasts for lumps and changes. It is best to do this while lying on your back on the floor and again while sitting or standing in the shower or tub with soapy water on your skin. Feel each breast in the following way:  Place the arm on the side of the breast you are examining above your head.  Feel your breast with the other hand.  Start in the nipple area and make  inch (2 cm) overlapping circles to feel your breast. Use the pads of your three middle fingers to do this. Apply light pressure, then medium pressure, then firm pressure. The light pressure will allow you to feel the tissue closest to the  skin. The medium pressure will allow you to feel the tissue that is a little deeper. The firm pressure will allow you to feel the tissue close to the ribs.  Continue the overlapping circles, moving downward over the breast until you feel your ribs below your breast.  Move one finger-width toward the center of the body. Continue to use the  inch (2 cm) overlapping circles to feel your breast as you move slowly up toward your collarbone.  Continue the up and down exam using all three pressures until you reach your armpit.  Write Down What You Find  Write down what is normal for each breast and any changes that you find. Keep a written record with breast changes or normal findings for each breast. By writing this information down, you do not need to depend only on memory for size, tenderness, or location. Write down where you are in your menstrual cycle, if you are still menstruating. If you are having trouble noticing differences in your breasts, do not get discouraged. With time you will become more familiar with the variations in your breasts and more comfortable with the exam. How often should I examine   my breasts? Examine your breasts every month. If you are breastfeeding, the best time to examine your breasts is after a feeding or after using a breast pump. If you menstruate, the best time to examine your breasts is 5-7 days after your period is over. During your period, your breasts are lumpier, and it may be more difficult to notice changes. When should I see my health care provider? See your health care provider if you notice:  A change in shape or size of your breasts or nipples.  A change in the skin of your breast or nipples, such as a reddened or scaly area.  Unusual discharge from your nipples.  A lump or thick area that was not there before.  Pain in your breasts.  Anything that concerns you.  

## 2020-06-05 LAB — COMPREHENSIVE METABOLIC PANEL
ALT: 13 IU/L (ref 0–32)
AST: 22 IU/L (ref 0–40)
Albumin/Globulin Ratio: 1.9 (ref 1.2–2.2)
Albumin: 4.6 g/dL (ref 3.8–4.9)
Alkaline Phosphatase: 117 IU/L (ref 44–121)
BUN/Creatinine Ratio: 13 (ref 9–23)
BUN: 12 mg/dL (ref 6–24)
Bilirubin Total: 0.4 mg/dL (ref 0.0–1.2)
CO2: 25 mmol/L (ref 20–29)
Calcium: 9.4 mg/dL (ref 8.7–10.2)
Chloride: 102 mmol/L (ref 96–106)
Creatinine, Ser: 0.92 mg/dL (ref 0.57–1.00)
GFR calc Af Amer: 80 mL/min/{1.73_m2} (ref >59)
GFR calc non Af Amer: 70 mL/min/{1.73_m2} (ref >59)
Globulin, Total: 2.4 g/dL (ref 1.5–4.5)
Glucose: 92 mg/dL (ref 65–99)
Potassium: 4.6 mmol/L (ref 3.5–5.2)
Sodium: 141 mmol/L (ref 134–144)
Total Protein: 7 g/dL (ref 6.0–8.5)

## 2020-06-05 LAB — LIPID PANEL
Chol/HDL Ratio: 3.2 ratio (ref 0.0–4.4)
Cholesterol, Total: 227 mg/dL — ABNORMAL HIGH (ref 100–199)
HDL: 72 mg/dL (ref >39)
LDL Chol Calc (NIH): 143 mg/dL — ABNORMAL HIGH (ref 0–99)
Triglycerides: 69 mg/dL (ref 0–149)
VLDL Cholesterol Cal: 12 mg/dL (ref 5–40)

## 2020-06-05 LAB — CBC
Hematocrit: 42.1 % (ref 34.0–46.6)
Hemoglobin: 14.6 g/dL (ref 11.1–15.9)
MCH: 32.4 pg (ref 26.6–33.0)
MCHC: 34.7 g/dL (ref 31.5–35.7)
MCV: 93 fL (ref 79–97)
Platelets: 230 10*3/uL (ref 150–450)
RBC: 4.51 x10E6/uL (ref 3.77–5.28)
RDW: 11.8 % (ref 11.7–15.4)
WBC: 4.7 10*3/uL (ref 3.4–10.8)

## 2020-07-31 DIAGNOSIS — M25562 Pain in left knee: Secondary | ICD-10-CM | POA: Insufficient documentation

## 2020-07-31 DIAGNOSIS — M7052 Other bursitis of knee, left knee: Secondary | ICD-10-CM | POA: Diagnosis not present

## 2020-09-04 DIAGNOSIS — E785 Hyperlipidemia, unspecified: Secondary | ICD-10-CM | POA: Diagnosis not present

## 2020-09-08 ENCOUNTER — Other Ambulatory Visit: Payer: Self-pay | Admitting: Internal Medicine

## 2020-09-08 DIAGNOSIS — E785 Hyperlipidemia, unspecified: Secondary | ICD-10-CM

## 2020-09-25 DIAGNOSIS — Z8619 Personal history of other infectious and parasitic diseases: Secondary | ICD-10-CM | POA: Diagnosis not present

## 2020-09-25 DIAGNOSIS — H9201 Otalgia, right ear: Secondary | ICD-10-CM | POA: Insufficient documentation

## 2020-09-29 ENCOUNTER — Ambulatory Visit
Admission: RE | Admit: 2020-09-29 | Discharge: 2020-09-29 | Disposition: A | Discharge status: Home / Self Care | Payer: No Typology Code available for payment source | Source: Ambulatory Visit | Attending: Internal Medicine | Admitting: Internal Medicine

## 2020-09-29 DIAGNOSIS — E785 Hyperlipidemia, unspecified: Secondary | ICD-10-CM

## 2020-10-03 DIAGNOSIS — H40053 Ocular hypertension, bilateral: Secondary | ICD-10-CM | POA: Diagnosis not present

## 2021-02-26 DIAGNOSIS — E785 Hyperlipidemia, unspecified: Secondary | ICD-10-CM | POA: Diagnosis not present

## 2021-03-05 DIAGNOSIS — R519 Headache, unspecified: Secondary | ICD-10-CM | POA: Diagnosis not present

## 2021-03-05 DIAGNOSIS — Z1339 Encounter for screening examination for other mental health and behavioral disorders: Secondary | ICD-10-CM | POA: Diagnosis not present

## 2021-03-05 DIAGNOSIS — Z0000 Encounter for general adult medical examination without abnormal findings: Secondary | ICD-10-CM | POA: Diagnosis not present

## 2021-03-05 DIAGNOSIS — Z1331 Encounter for screening for depression: Secondary | ICD-10-CM | POA: Diagnosis not present

## 2021-05-04 DIAGNOSIS — Z1231 Encounter for screening mammogram for malignant neoplasm of breast: Secondary | ICD-10-CM | POA: Diagnosis not present

## 2021-05-06 ENCOUNTER — Encounter: Payer: Self-pay | Admitting: Obstetrics and Gynecology

## 2021-06-12 DIAGNOSIS — W11XXXA Fall on and from ladder, initial encounter: Secondary | ICD-10-CM | POA: Diagnosis not present

## 2021-06-12 DIAGNOSIS — R0781 Pleurodynia: Secondary | ICD-10-CM | POA: Diagnosis not present

## 2021-06-12 DIAGNOSIS — S299XXA Unspecified injury of thorax, initial encounter: Secondary | ICD-10-CM | POA: Diagnosis not present

## 2021-06-15 NOTE — Progress Notes (Signed)
55 y.o. [id number] Married White or Caucasian Not Hispanic or Latino female here for annual exam.     She had a cardiac CT, had some small build up, was started on lipitor. She had bad side effects from the lipitor, so she stopped it.   Patient's last menstrual period was 07/18/2016.          Sexually active: No.  The current method of family planning is post menopausal status.    Exercising: Yes.     Spin and walking the dog  Smoker:  no  Health Maintenance: Pap:  05/19/2017 WNL NEG HR HPV  History of abnormal Pap:  yes repeat was normal  MMG:  05/06/21 Bi-rads 1 neg  BMD:   never  Colonoscopy: 2019 normal  TDaP:  12/27/16  Gardasil: n/a   reports that she has never smoked. She has never used smokeless tobacco. She reports current alcohol use of about 1.0 - 2.0 standard drink per week. She reports that she does not use drugs. Homemaker. Husband is semi-retired, was a Building services engineer, has a new job in [place]. Daughter graduated from Pharmacy school, doing an Oncology fellowship. Son works as a Research officer, political party in [place].     Past Medical History:  Diagnosis Date   Anxiety     Past Surgical History:  Procedure Laterality Date   NO PAST SURGERIES      Current Outpatient Medications  Medication Sig Dispense Refill   escitalopram (LEXAPRO) 10 MG tablet Take 1 tablet (10 mg total) by mouth daily. 90 tablet 3   Current Facility-Administered Medications  Medication Dose Route Frequency Provider Last Rate Last Admin   0.9 %  sodium chloride infusion  500 mL Intravenous Continuous Nandigam, Eleonore Chiquito, MD        Family History  Problem Relation Age of Onset   Osteoarthritis Mother    Osteoporosis Mother    Thyroid disease Mother    Pancreatic cancer Father    Heart disease Brother    Psoriasis Brother    Diabetes Brother    Rheum arthritis Maternal Uncle    Breast cancer Maternal Uncle    Heart attack Maternal Grandmother 5   Aneurysm Paternal Grandmother    Heart disease  Paternal Grandfather    Diabetes Maternal Aunt    Colon cancer Neg Hx     Review of Systems  All other systems reviewed and are negative.  Exam:   BP 140/90   Pulse (!) 46   Ht 5\' 4"  (1.626 m)   Wt 125 lb 6.4 oz (56.9 kg)   LMP 07/18/2016   SpO2 96%   BMI 21.52 kg/m   Weight change: @WEIGHTCHANGE @ Height:   Height: 5\' 4"  (162.6 cm)  Ht Readings from Last 3 Encounters:  06/17/21 5\' 4"  (1.626 m)  06/04/20 5\' 4"  (1.626 m)  05/30/19 5\' 4"  (1.626 m)    General appearance: alert, cooperative and appears stated age Head: Normocephalic, without obvious abnormality, atraumatic Neck: no adenopathy, supple, symmetrical, trachea midline and thyroid normal to inspection and palpation Lungs: clear to auscultation bilaterally Cardiovascular: regular rate and rhythm Breasts: normal appearance, no masses or tenderness Abdomen: soft, non-tender; non distended,  no masses,  no organomegaly Extremities: extremities normal, atraumatic, no cyanosis or edema Skin: Skin color, texture, turgor normal. No rashes or lesions Lymph nodes: Cervical, supraclavicular, and axillary nodes normal. No abnormal inguinal nodes palpated Neurologic: Grossly normal   Pelvic: External genitalia:  no lesions  Urethra:  normal appearing urethra with no masses, tenderness or lesions              Bartholins and Skenes: normal                 Vagina: atrophic appearing vagina with normal color and discharge, no lesions              Cervix: no lesions               Bimanual Exam:  Uterus:  normal size, contour, position, consistency, mobility, non-tender              Adnexa: no mass, fullness, tenderness               Rectovaginal: Confirms               Anus:  normal sphincter tone, no lesions  Carolynn Serve chaperoned for the exam.  1. Well woman exam Pap next year Mammogram and colonoscopy UTD Labs with primary Discussed breast self exam Discussed calcium and vit D intake  2. History of  anxiety - escitalopram (LEXAPRO) 10 MG tablet; Take 1 tablet (10 mg total) by mouth daily.  Dispense: 90 tablet; Refill: 3

## 2021-06-17 ENCOUNTER — Ambulatory Visit (INDEPENDENT_AMBULATORY_CARE_PROVIDER_SITE_OTHER): Payer: BC Managed Care – PPO | Admitting: Obstetrics and Gynecology

## 2021-06-17 ENCOUNTER — Encounter: Payer: Self-pay | Admitting: Obstetrics and Gynecology

## 2021-06-17 ENCOUNTER — Other Ambulatory Visit: Payer: Self-pay

## 2021-06-17 VITALS — BP 140/90 | HR 46 | Ht 64.0 in | Wt 125.4 lb

## 2021-06-17 DIAGNOSIS — Z01419 Encounter for gynecological examination (general) (routine) without abnormal findings: Secondary | ICD-10-CM | POA: Diagnosis not present

## 2021-06-17 DIAGNOSIS — Z8659 Personal history of other mental and behavioral disorders: Secondary | ICD-10-CM

## 2021-06-17 MED ORDER — ESCITALOPRAM OXALATE 10 MG PO TABS: 10.0000 mg | ORAL_TABLET | Freq: Every day | ORAL | 3 refills | Status: DC

## 2021-06-17 NOTE — Patient Instructions (Signed)

## 2021-08-27 DIAGNOSIS — F4323 Adjustment disorder with mixed anxiety and depressed mood: Secondary | ICD-10-CM | POA: Diagnosis not present

## 2021-09-01 DIAGNOSIS — F4323 Adjustment disorder with mixed anxiety and depressed mood: Secondary | ICD-10-CM | POA: Diagnosis not present

## 2021-09-15 DIAGNOSIS — F4323 Adjustment disorder with mixed anxiety and depressed mood: Secondary | ICD-10-CM | POA: Diagnosis not present

## 2021-10-07 DIAGNOSIS — F4323 Adjustment disorder with mixed anxiety and depressed mood: Secondary | ICD-10-CM | POA: Diagnosis not present

## 2021-10-29 DIAGNOSIS — F4323 Adjustment disorder with mixed anxiety and depressed mood: Secondary | ICD-10-CM | POA: Diagnosis not present

## 2021-11-10 DIAGNOSIS — H40013 Open angle with borderline findings, low risk, bilateral: Secondary | ICD-10-CM | POA: Diagnosis not present

## 2021-12-01 DIAGNOSIS — F4323 Adjustment disorder with mixed anxiety and depressed mood: Secondary | ICD-10-CM | POA: Diagnosis not present

## 2022-01-19 IMAGING — CT CT CARDIAC CORONARY ARTERY CALCIUM SCORE
3 series · 13 of 20 positions shown, 15 images · non-contrast
Comparison: None.

CLINICAL DATA: Elevated cholesterol. Family history of heart
disease.

EXAM:
CT CARDIAC CORONARY ARTERY CALCIUM SCORE
TECHNIQUE: Non-contrast imaging through the heart was performed using
prospective ECG gating. Image post processing was performed on an
independent workstation, allowing for quantitative analysis of the
heart and coronary arteries. Note that this exam targets the heart
and the chest was not imaged in its entirety.

[Series 2: calcium scoring 2.00 qr36 bestdiast 68% hrt calciu · axial · 0.35mm/px · z∈[+1716,+1772]mm · 3 of 70 slices shown]
[im 14/70  vessel]
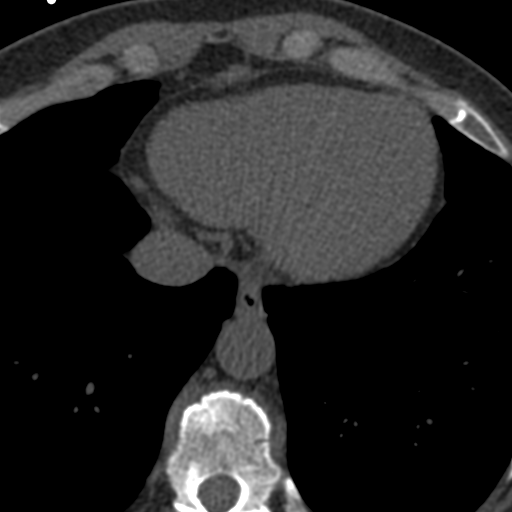
[im 28/70  vessel]
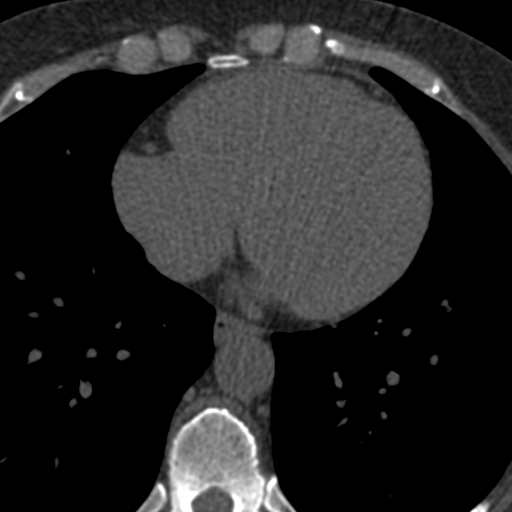
[im 42/70  vessel]
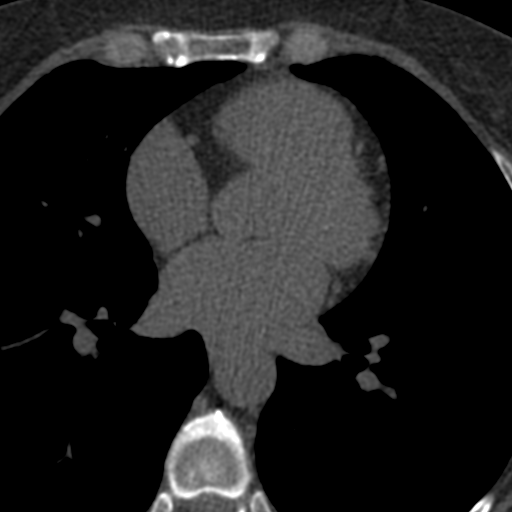

[Series 3: calcium scoring 2.00 br40 bestdiast 68% axial · axial · 0.51mm/px · z∈[+1713,+1805]mm · 5 of 70 slices shown, 7 images]
[im 12/70  vessel]
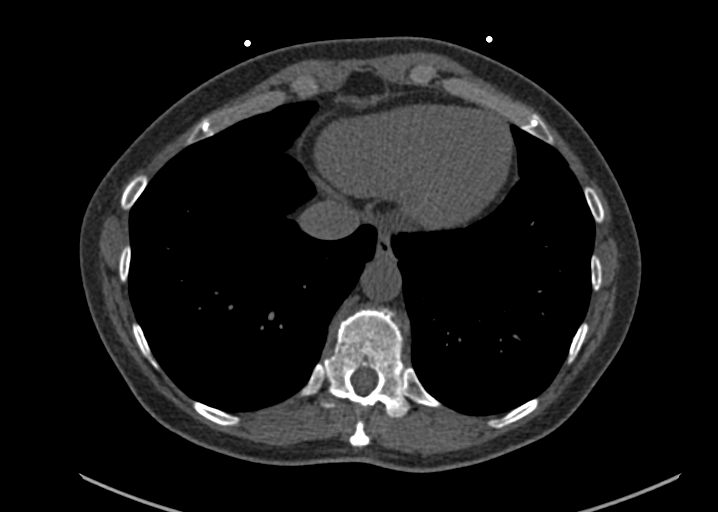
[im 12/70  lung]
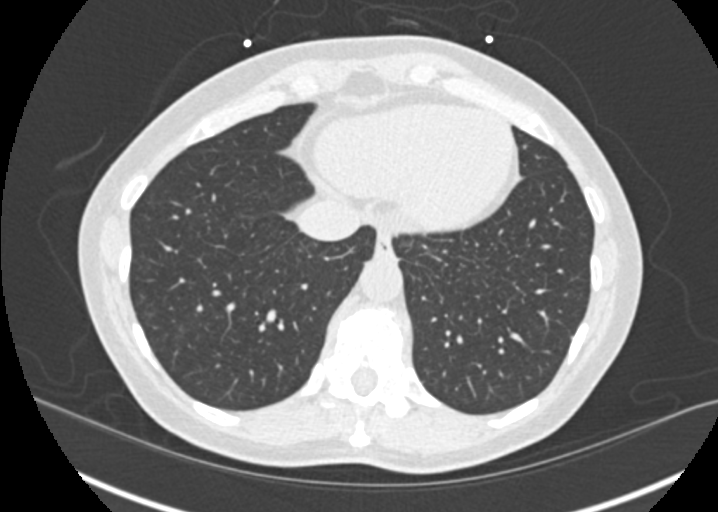
[im 24/70  vessel]
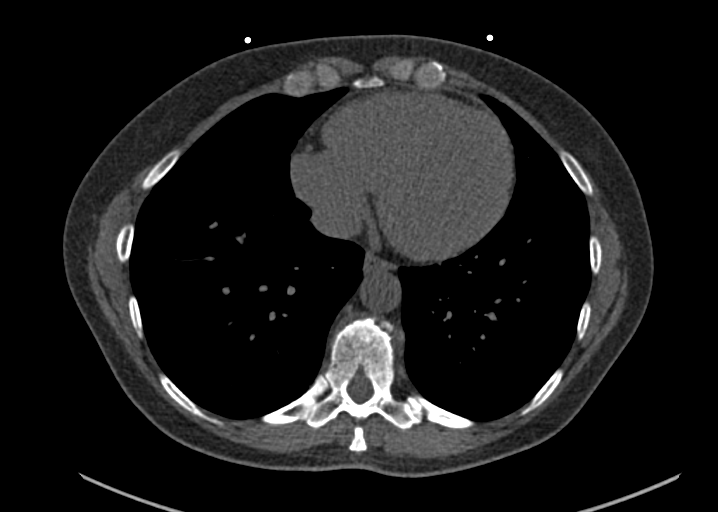
[im 35/70  vessel]
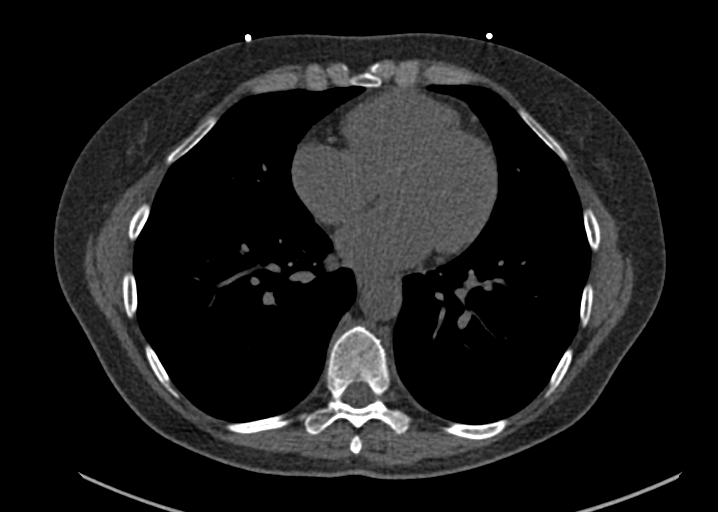
[im 47/70  vessel]
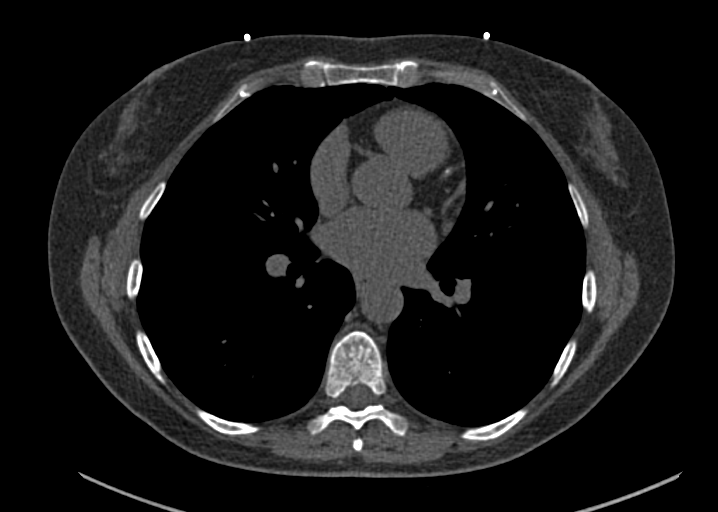
[im 58/70  vessel]
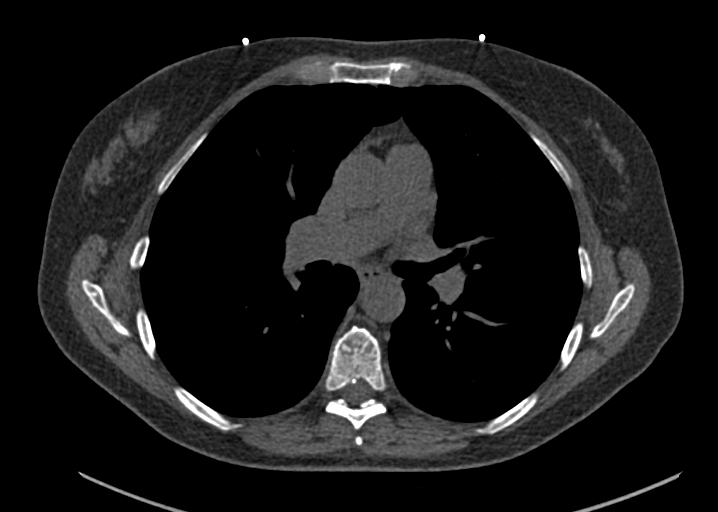
[im 58/70  lung]
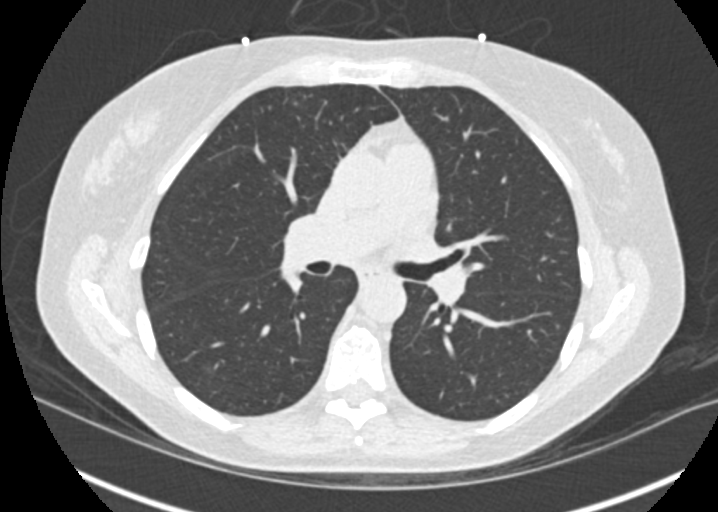

[Series 9: calcium scoring 2.00 br60 bestdiast 68% lungs · axial · 0.51mm/px · z∈[+1713,+1805]mm · 5 of 70 slices shown]
[im 12/70  vessel]
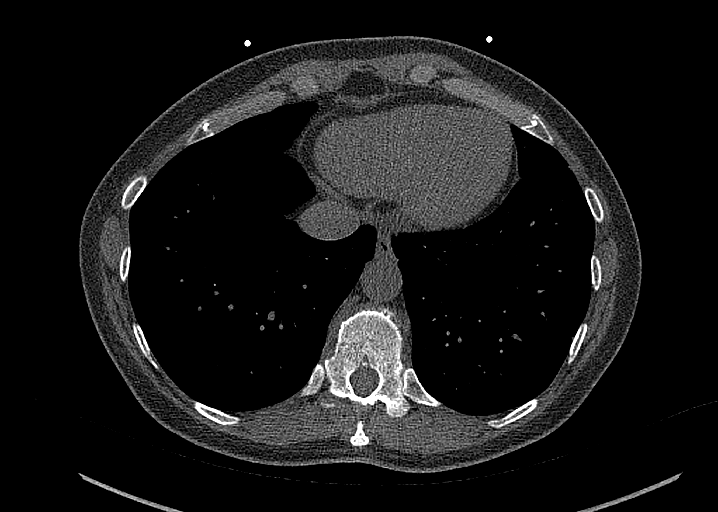
[im 24/70  vessel]
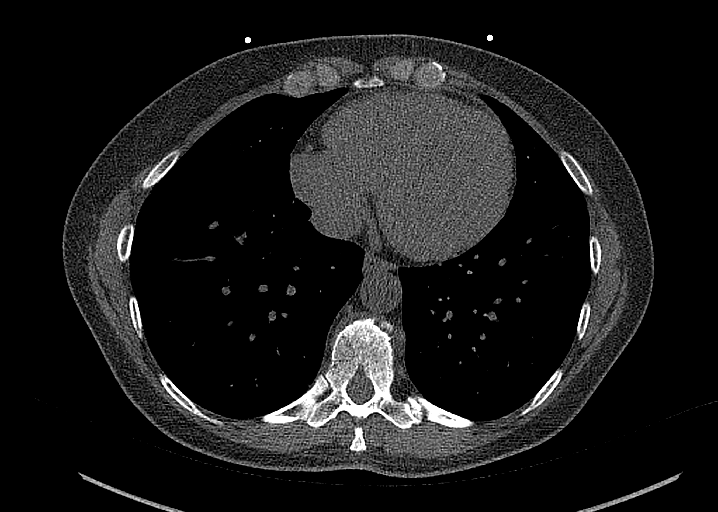
[im 35/70  vessel]
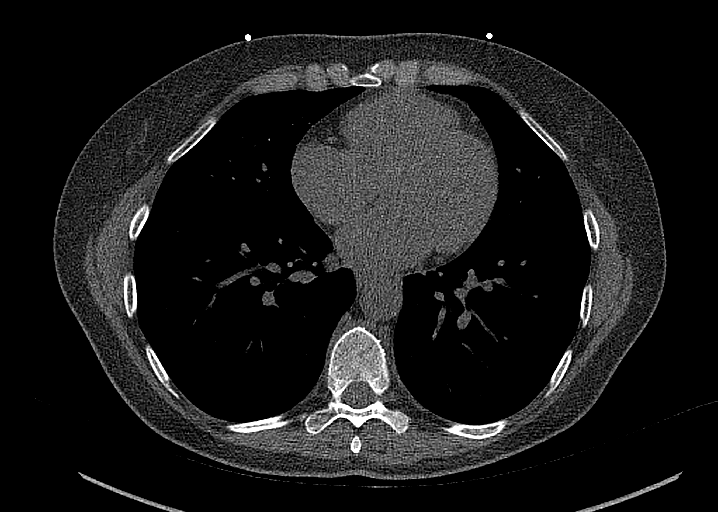
[im 47/70  vessel]
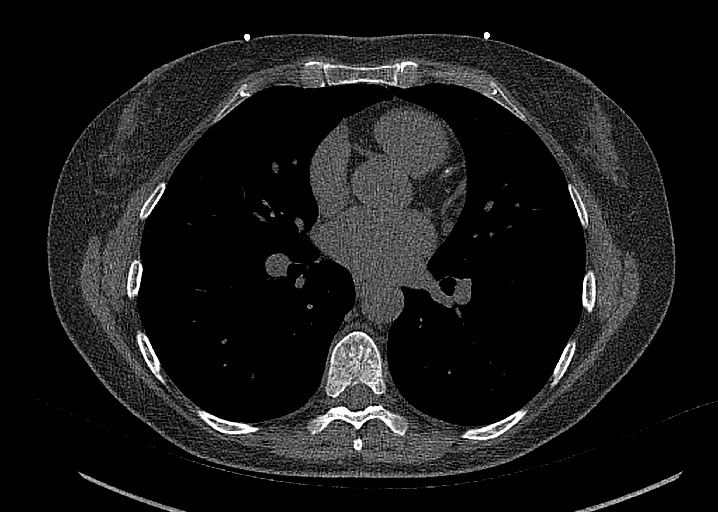
[im 58/70  vessel]
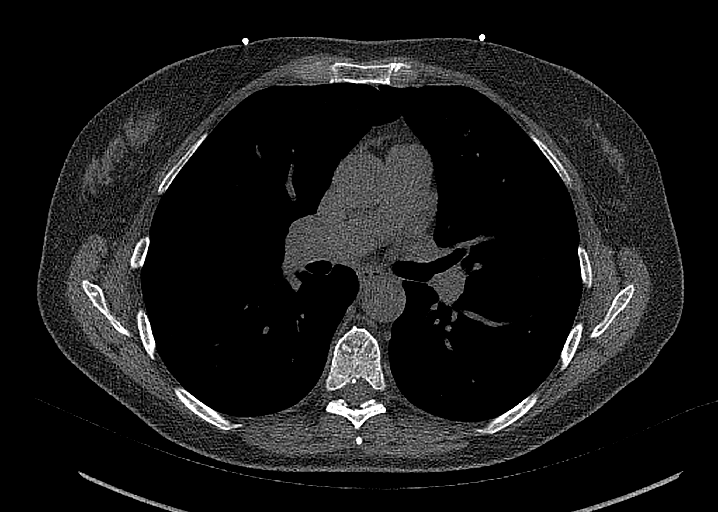

[13 of 20 positions shown; findings below may reference images not displayed]

FINDINGS: CORONARY CALCIUM SCORES:

Left Main: 0

LAD:

LCx: 0

RCA: 0

Total Agatston Score:

[HOSPITAL] percentile: 83rd

AORTA MEASUREMENTS:

Ascending Aorta: 25 mm

Descending Aorta: 19 mm

OTHER FINDINGS:

Cardiovascular: Normal aortic caliber. Normal heart size, without
pericardial effusion.

Mediastinum/Nodes: No imaged thoracic adenopathy.

Lungs/Pleura: No pleural fluid. 2 mm left lower lobe pulmonary
nodule is likely a calcified granuloma on [DATE].

Upper Abdomen: Normal imaged portions of the liver, spleen.

Musculoskeletal: Moderate thoracic spondylosis.
IMPRESSION: 1. Total Agatston score of 17.1, corresponding to 83rd percentile
for age, sex, and race based cohort.
2. No acute process in the imaged chest.

## 2022-02-19 DIAGNOSIS — Z6822 Body mass index (BMI) 22.0-22.9, adult: Secondary | ICD-10-CM | POA: Diagnosis not present

## 2022-02-19 DIAGNOSIS — L209 Atopic dermatitis, unspecified: Secondary | ICD-10-CM | POA: Diagnosis not present

## 2022-03-22 DIAGNOSIS — Z1231 Encounter for screening mammogram for malignant neoplasm of breast: Secondary | ICD-10-CM | POA: Diagnosis not present

## 2022-03-22 DIAGNOSIS — F419 Anxiety disorder, unspecified: Secondary | ICD-10-CM | POA: Diagnosis not present

## 2022-03-22 DIAGNOSIS — L2082 Flexural eczema: Secondary | ICD-10-CM | POA: Diagnosis not present

## 2022-03-22 DIAGNOSIS — Z1159 Encounter for screening for other viral diseases: Secondary | ICD-10-CM | POA: Diagnosis not present

## 2022-03-22 DIAGNOSIS — Z0000 Encounter for general adult medical examination without abnormal findings: Secondary | ICD-10-CM | POA: Diagnosis not present

## 2022-03-26 DIAGNOSIS — L2082 Flexural eczema: Secondary | ICD-10-CM | POA: Insufficient documentation

## 2022-03-26 DIAGNOSIS — R030 Elevated blood-pressure reading, without diagnosis of hypertension: Secondary | ICD-10-CM | POA: Insufficient documentation

## 2022-03-26 DIAGNOSIS — F419 Anxiety disorder, unspecified: Secondary | ICD-10-CM | POA: Insufficient documentation

## 2022-05-05 DIAGNOSIS — Z1231 Encounter for screening mammogram for malignant neoplasm of breast: Secondary | ICD-10-CM | POA: Diagnosis not present

## 2022-05-29 DIAGNOSIS — U071 COVID-19: Secondary | ICD-10-CM | POA: Diagnosis not present

## 2022-06-14 NOTE — Progress Notes (Signed)
56 y.o. [id number] Married White or Caucasian Not Hispanic or Latino female here for annual exam.  No vaginal bleeding. Not sexually active.    Maternal 1st cousin was just diagnosed with ovarian cancer at age 30. Her mother had breast cancer (her maternal aunt) in her 21's.   Paternal 1st cousin with ovarian cancer in her late 21's and that cousins sister was diagnosed with breast cancer at 110.   Patient's last menstrual period was 07/18/2016.          Sexually active: No.  The current method of family planning is post menopausal status.    Exercising: Yes.     Cardio barr  Smoker:  no  Health Maintenance: Pap:  05/19/2017 WNL NEG HR HPV   History of abnormal Pap:  yes repeat was normal  MMG:  05/05/22 Bi-rads 1 neg  BMD:   none Colonoscopy: 2019 normal, f/u 10 years  TDaP:  12/27/16  Gardasil: n/a   reports that she has never smoked. She has never used smokeless tobacco. She reports current alcohol use of about 1.0 - 2.0 standard drink of alcohol per week. She reports that she does not use drugs. Homemaker. Husband is semi-retired, was a Building services engineer. Daughter is a Teacher, early years/pre, living in [place], [place].  Currently estranged from her Son (since his wedding earlier this year).  Past Medical History:  Diagnosis Date   Anxiety     Past Surgical History:  Procedure Laterality Date   NO PAST SURGERIES      Current Outpatient Medications  Medication Sig Dispense Refill   escitalopram (LEXAPRO) 10 MG tablet Take 1 tablet (10 mg total) by mouth daily. 90 tablet 3   Current Facility-Administered Medications  Medication Dose Route Frequency Provider Last Rate Last Admin   0.9 %  sodium chloride infusion  500 mL Intravenous Continuous Nandigam, Eleonore Chiquito, MD        Family History  Problem Relation Age of Onset   Osteoarthritis Mother    Osteoporosis Mother    Thyroid disease Mother    Pancreatic cancer Father    Heart disease Brother    Psoriasis Brother    Diabetes Brother    Breast cancer  Maternal Aunt 62   Diabetes Maternal Aunt    Rheum arthritis Maternal Uncle    Heart attack Maternal Grandmother 34   Aneurysm Paternal Grandmother    Heart disease Paternal Grandfather    Ovarian cancer Cousin 13       maternal 1st cousin   Ovarian cancer Cousin 24       paternal 1st cousin   Breast cancer Cousin 49       paternal 1st cousin   Colon cancer Neg Hx     Review of Systems  All other systems reviewed and are negative.   Exam:   BP 138/86   Pulse 77   Ht 5\' 4"  (1.626 m)   Wt 131 lb (59.4 kg)   LMP 07/18/2016   SpO2 100%   BMI 22.49 kg/m   Weight change: @WEIGHTCHANGE @ Height:   Height: 5\' 4"  (162.6 cm)  Ht Readings from Last 3 Encounters:  06/23/22 5\' 4"  (1.626 m)  06/17/21 5\' 4"  (1.626 m)  06/04/20 5\' 4"  (1.626 m)    General appearance: alert, cooperative and appears stated age Head: Normocephalic, without obvious abnormality, atraumatic Neck: no adenopathy, supple, symmetrical, trachea midline and thyroid normal to inspection and palpation Lungs: clear to auscultation bilaterally Cardiovascular: regular rate and rhythm Breasts: normal appearance, no  masses or tenderness Abdomen: soft, non-tender; non distended,  no masses,  no organomegaly Extremities: extremities normal, atraumatic, no cyanosis or edema Skin: Skin color, texture, turgor normal. No rashes or lesions Lymph nodes: Cervical, supraclavicular, and axillary nodes normal. No abnormal inguinal nodes palpated Neurologic: Grossly normal   Pelvic: External genitalia:  no lesions              Urethra:  normal appearing urethra with no masses, tenderness or lesions              Bartholins and Skenes: normal                 Vagina: normal appearing vagina with normal color and discharge, no lesions              Cervix: no lesions               Bimanual Exam:  Uterus:  normal size, contour, position, consistency, mobility, non-tender              Adnexa: no mass, fullness, tenderness                Rectovaginal: Confirms               Anus:  normal sphincter tone, no lesions  Abbott Pao, CMA chaperoned for the exam.  1. Well woman exam Discussed breast self exam Discussed calcium and vit D intake Mammogram and colonoscopy are UTD Labs with primary  2. Screening for cervical cancer - Cytology - PAP  3. Family history of ovarian cancer Referral to Genetics  4. Family history of breast cancer Referral to [facility]

## 2022-06-23 ENCOUNTER — Other Ambulatory Visit (HOSPITAL_COMMUNITY)
Admission: RE | Admit: 2022-06-23 | Discharge: 2022-06-23 | Disposition: A | Discharge status: Home / Self Care | Payer: BC Managed Care – PPO | Source: Ambulatory Visit | Attending: Obstetrics and Gynecology | Admitting: Obstetrics and Gynecology

## 2022-06-23 ENCOUNTER — Ambulatory Visit (INDEPENDENT_AMBULATORY_CARE_PROVIDER_SITE_OTHER): Payer: BC Managed Care – PPO | Admitting: Obstetrics and Gynecology

## 2022-06-23 ENCOUNTER — Encounter: Payer: Self-pay | Admitting: Obstetrics and Gynecology

## 2022-06-23 VITALS — BP 138/86 | HR 77 | Ht 64.0 in | Wt 131.0 lb

## 2022-06-23 DIAGNOSIS — Z803 Family history of malignant neoplasm of breast: Secondary | ICD-10-CM

## 2022-06-23 DIAGNOSIS — Z01419 Encounter for gynecological examination (general) (routine) without abnormal findings: Secondary | ICD-10-CM | POA: Diagnosis not present

## 2022-06-23 DIAGNOSIS — Z8041 Family history of malignant neoplasm of ovary: Secondary | ICD-10-CM

## 2022-06-23 DIAGNOSIS — Z124 Encounter for screening for malignant neoplasm of cervix: Secondary | ICD-10-CM | POA: Insufficient documentation

## 2022-06-23 NOTE — Patient Instructions (Signed)

## 2022-06-24 ENCOUNTER — Telehealth: Payer: Self-pay

## 2022-06-24 NOTE — Telephone Encounter (Signed)
-----   Message from Romualdo Bolk, MD sent at 06/23/2022  9:29 AM EST ----- Can you please place a referral for Ryenn to see a genetic counselor in the [place] area. FH of ovarian and breast cancer. Thanks, Noreene Larsson

## 2022-06-24 NOTE — Telephone Encounter (Signed)
I have found a few different locations, will try to contact pt to see if she has a preference or would like a referral to sent to all to see whom she can see sooner.  LVMTCB.  Locations: [facility] (Referral Form Online) [address] [place], [place] [address] P: [phone] F: [phone]  [facility] (Referral Form Online) [phone] [place], [place] [address] P: [phone] F: [phone]  [facility] (No Referral Needed) [address] [place], [place] [address] P: [phone]

## 2022-06-24 NOTE — Telephone Encounter (Signed)
Patient called back she asked me to send the below list via my chart and she will check with her insurance and get back with the office about which location she would like the referral sent.

## 2022-06-25 LAB — CYTOLOGY - PAP
Comment: NEGATIVE
Diagnosis: NEGATIVE
High risk HPV: NEGATIVE

## 2022-07-09 ENCOUNTER — Other Ambulatory Visit: Payer: Self-pay | Admitting: Obstetrics and Gynecology

## 2022-07-09 DIAGNOSIS — Z8659 Personal history of other mental and behavioral disorders: Secondary | ICD-10-CM

## 2022-07-09 NOTE — Telephone Encounter (Signed)
Last AEX w/ JJ 06/23/2022 No mention of medication in plan. Last filled December 2022

## 2022-07-09 NOTE — Telephone Encounter (Signed)
Will  need visit to restart

## 2022-07-09 NOTE — Telephone Encounter (Signed)
JJ spoke with pt about Lexapro use. Pt is okay with waiting until return. Last seen 06/23/22.

## 2022-07-14 NOTE — Telephone Encounter (Signed)
Left detailed msg per DPR to return call with info re: location if considered for genetic counseling.

## 2022-07-20 NOTE — Telephone Encounter (Signed)
Refill sent for the year

## 2022-08-19 NOTE — Telephone Encounter (Signed)
FYI. Followed up with pt re: genetic referral and pt reported that she was advised by you to have as much info as possible before getting scheduled with one and as of right now she is currently waiting on more info from her cousin who just had surgery for her ovarian cancer. 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 Pt advised that I will inquire from you if you would like [place] to put this on hold for now and just wait on pt to call when she feels she is ready for referral? Please advise.

## 2022-08-20 NOTE — Telephone Encounter (Signed)
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 I would recommend that she wait for some more information before she sees the genetic counselor

## 2022-08-20 NOTE — Telephone Encounter (Signed)
Referral not sent for now, will wait for pt to retain more information on family member(s). Will close encounter.
